# Patient Record
Sex: Female | Born: 1950 | Race: White | Hispanic: No | Smoking: Former smoker
Health system: Southern US, Community
[De-identification: ages and names within clinical notes are randomized; demographics above are authoritative.]

## PROBLEM LIST (undated history)

## (undated) DIAGNOSIS — I10 Essential (primary) hypertension: Secondary | ICD-10-CM

## (undated) DIAGNOSIS — J45909 Unspecified asthma, uncomplicated: Secondary | ICD-10-CM

## (undated) DIAGNOSIS — M549 Dorsalgia, unspecified: Secondary | ICD-10-CM

## (undated) DIAGNOSIS — M2550 Pain in unspecified joint: Secondary | ICD-10-CM

## (undated) DIAGNOSIS — U099 Post covid-19 condition, unspecified: Secondary | ICD-10-CM

## (undated) DIAGNOSIS — F32A Depression, unspecified: Secondary | ICD-10-CM

## (undated) DIAGNOSIS — R5383 Other fatigue: Secondary | ICD-10-CM

## (undated) DIAGNOSIS — E7800 Pure hypercholesterolemia, unspecified: Secondary | ICD-10-CM

## (undated) DIAGNOSIS — R194 Change in bowel habit: Secondary | ICD-10-CM

## (undated) DIAGNOSIS — D72819 Decreased white blood cell count, unspecified: Secondary | ICD-10-CM

## (undated) DIAGNOSIS — J449 Chronic obstructive pulmonary disease, unspecified: Secondary | ICD-10-CM

## (undated) HISTORY — DX: Depression, unspecified: F32.A

## (undated) HISTORY — DX: Post covid-19 condition, unspecified: U09.9

## (undated) HISTORY — PX: PROLAPSED UTERINE FIBROID LIGATION: SHX5400

## (undated) HISTORY — DX: Unspecified asthma, uncomplicated: J45.909

## (undated) HISTORY — DX: Dorsalgia, unspecified: M54.9

## (undated) HISTORY — DX: Other fatigue: R53.83

## (undated) HISTORY — DX: Pure hypercholesterolemia, unspecified: E78.00

## (undated) HISTORY — DX: Decreased white blood cell count, unspecified: D72.819

## (undated) HISTORY — DX: Chronic obstructive pulmonary disease, unspecified: J44.9

## (undated) HISTORY — DX: Change in bowel habit: R19.4

## (undated) HISTORY — DX: Essential (primary) hypertension: I10

## (undated) HISTORY — PX: BREAST BIOPSY: SHX20

## (undated) HISTORY — DX: Pain in unspecified joint: M25.50

---

## 2003-10-28 ENCOUNTER — Other Ambulatory Visit: Admission: RE | Admit: 2003-10-28 | Discharge: 2003-10-28 | Payer: Self-pay | Admitting: Family Medicine

## 2005-01-07 ENCOUNTER — Other Ambulatory Visit: Admission: RE | Admit: 2005-01-07 | Discharge: 2005-01-07 | Payer: Self-pay | Admitting: Family Medicine

## 2005-02-07 ENCOUNTER — Encounter (INDEPENDENT_AMBULATORY_CARE_PROVIDER_SITE_OTHER): Payer: Self-pay | Admitting: Specialist

## 2005-02-07 ENCOUNTER — Ambulatory Visit (HOSPITAL_COMMUNITY): Admission: RE | Admit: 2005-02-07 | Discharge: 2005-02-07 | Payer: Self-pay | Admitting: Gastroenterology

## 2006-04-14 ENCOUNTER — Encounter: Admission: RE | Admit: 2006-04-14 | Discharge: 2006-04-14 | Payer: Self-pay | Admitting: Family Medicine

## 2007-06-30 ENCOUNTER — Ambulatory Visit: Payer: Self-pay | Admitting: Unknown Physician Specialty

## 2007-07-02 ENCOUNTER — Ambulatory Visit: Payer: Self-pay | Admitting: Unknown Physician Specialty

## 2007-12-25 ENCOUNTER — Encounter: Admission: RE | Admit: 2007-12-25 | Discharge: 2007-12-25 | Payer: Self-pay | Admitting: Family Medicine

## 2008-04-01 ENCOUNTER — Emergency Department (HOSPITAL_COMMUNITY): Admission: EM | Admit: 2008-04-01 | Discharge: 2008-04-01 | Payer: Self-pay | Admitting: Emergency Medicine

## 2009-01-23 ENCOUNTER — Encounter: Admission: RE | Admit: 2009-01-23 | Discharge: 2009-01-23 | Payer: Self-pay | Admitting: Family Medicine

## 2009-04-14 ENCOUNTER — Encounter: Admission: RE | Admit: 2009-04-14 | Discharge: 2009-04-14 | Payer: Self-pay | Admitting: Family Medicine

## 2009-10-24 ENCOUNTER — Emergency Department (HOSPITAL_COMMUNITY): Admission: EM | Admit: 2009-10-24 | Discharge: 2009-10-24 | Payer: Self-pay | Admitting: Emergency Medicine

## 2010-12-12 LAB — POCT CARDIAC MARKERS
CKMB, poc: 1.9 ng/mL (ref 1.0–8.0)
Myoglobin, poc: 59.9 ng/mL (ref 12–200)
Troponin i, poc: <0.05 ng/mL (ref 0.00–0.09)
Troponin i, poc: <0.05 ng/mL (ref 0.00–0.09)

## 2010-12-12 LAB — DIFFERENTIAL
Lymphocytes Relative: 34 % (ref 12–46)
Monocytes Absolute: 0.5 10*3/uL (ref 0.1–1.0)
Neutro Abs: 4.5 10*3/uL (ref 1.7–7.7)
Neutrophils Relative %: 56 % (ref 43–77)

## 2010-12-12 LAB — URINALYSIS, ROUTINE W REFLEX MICROSCOPIC
Glucose, UA: NEGATIVE mg/dL
Ketones, ur: NEGATIVE mg/dL
Leukocytes, UA: NEGATIVE
Nitrite: NEGATIVE
Urobilinogen, UA: 0.2 mg/dL (ref 0.0–1.0)
pH: 6 (ref 5.0–8.0)

## 2010-12-12 LAB — BASIC METABOLIC PANEL
BUN: 19 mg/dL (ref 6–23)
CO2: 26 mEq/L (ref 19–32)
Calcium: 9.4 mg/dL (ref 8.4–10.5)
Chloride: 106 mEq/L (ref 96–112)
Creatinine, Ser: 0.61 mg/dL (ref 0.4–1.2)

## 2010-12-12 LAB — CBC
MCHC: 35.2 g/dL (ref 30.0–36.0)
MCV: 93.6 fL (ref 78.0–100.0)
RDW: 12.8 % (ref 11.5–15.5)

## 2010-12-12 LAB — URINE MICROSCOPIC-ADD ON

## 2011-06-20 LAB — DIFFERENTIAL
Basophils Absolute: 0.1
Basophils Relative: 1
Eosinophils Absolute: 0.2
Eosinophils Relative: 3
Lymphocytes Relative: 37
Lymphs Abs: 2.6
Monocytes Absolute: 0.5
Monocytes Relative: 7
Neutro Abs: 3.7
Neutrophils Relative %: 53

## 2011-06-20 LAB — CBC
HCT: 39.8
Hemoglobin: 13.9
MCHC: 34.8
MCV: 91.6
Platelets: 230
RBC: 4.35
RDW: 12.8
WBC: 7

## 2011-06-20 LAB — COMPREHENSIVE METABOLIC PANEL
ALT: 20
BUN: 13
CO2: 26
Chloride: 110
Creatinine, Ser: 0.6
GFR calc Af Amer: >60
Potassium: 3.8

## 2011-06-20 LAB — COMPREHENSIVE METABOLIC PANEL WITH GFR
AST: 22
Albumin: 3.7
Alkaline Phosphatase: 56
Calcium: 9.1
GFR calc non Af Amer: >60
Glucose, Bld: 101 — ABNORMAL HIGH
Sodium: 143
Total Bilirubin: 0.7
Total Protein: 5.7 — ABNORMAL LOW

## 2011-06-20 LAB — POCT CARDIAC MARKERS
CKMB, poc: 1.2
CKMB, poc: 1.5
Myoglobin, poc: 42
Myoglobin, poc: 42.6
Operator id: 231701
Operator id: 231701
Troponin i, poc: <0.05
Troponin i, poc: <0.05

## 2011-07-26 ENCOUNTER — Other Ambulatory Visit: Payer: Self-pay | Admitting: Family Medicine

## 2011-07-26 ENCOUNTER — Ambulatory Visit
Admission: RE | Admit: 2011-07-26 | Discharge: 2011-07-26 | Disposition: A | Discharge status: Home / Self Care | Payer: BC Managed Care – PPO | Source: Ambulatory Visit | Attending: Family Medicine | Admitting: Family Medicine

## 2011-07-26 DIAGNOSIS — R1030 Lower abdominal pain, unspecified: Secondary | ICD-10-CM

## 2011-07-26 DIAGNOSIS — R509 Fever, unspecified: Secondary | ICD-10-CM

## 2011-07-26 MED ORDER — IOHEXOL 300 MG/ML  SOLN
100.0000 mL | Freq: Once | INTRAMUSCULAR | Status: AC | PRN
  Administered 2011-07-26: 100 mL via INTRAVENOUS

## 2013-05-19 ENCOUNTER — Other Ambulatory Visit (HOSPITAL_COMMUNITY): Payer: Self-pay | Admitting: Family Medicine

## 2013-05-19 DIAGNOSIS — Z1231 Encounter for screening mammogram for malignant neoplasm of breast: Secondary | ICD-10-CM

## 2013-05-25 ENCOUNTER — Ambulatory Visit (HOSPITAL_COMMUNITY)
Admission: RE | Admit: 2013-05-25 | Discharge: 2013-05-25 | Disposition: A | Discharge status: Home / Self Care | Payer: BC Managed Care – PPO | Source: Ambulatory Visit | Attending: Family Medicine | Admitting: Family Medicine

## 2013-05-25 DIAGNOSIS — Z1231 Encounter for screening mammogram for malignant neoplasm of breast: Secondary | ICD-10-CM | POA: Insufficient documentation

## 2013-12-17 ENCOUNTER — Other Ambulatory Visit: Payer: Self-pay | Admitting: Family Medicine

## 2013-12-17 DIAGNOSIS — Q282 Arteriovenous malformation of cerebral vessels: Secondary | ICD-10-CM

## 2013-12-28 ENCOUNTER — Other Ambulatory Visit: Payer: BC Managed Care – PPO

## 2014-01-03 ENCOUNTER — Ambulatory Visit
Admission: RE | Admit: 2014-01-03 | Discharge: 2014-01-03 | Disposition: A | Discharge status: Home / Self Care | Payer: BC Managed Care – PPO | Source: Ambulatory Visit | Attending: Family Medicine | Admitting: Family Medicine

## 2014-01-03 DIAGNOSIS — Q282 Arteriovenous malformation of cerebral vessels: Secondary | ICD-10-CM

## 2014-01-03 MED ORDER — GADOBENATE DIMEGLUMINE 529 MG/ML IV SOLN
13.0000 mL | Freq: Once | INTRAVENOUS | Status: AC | PRN
Start: 2014-01-03 — End: 2014-01-03
  Administered 2014-01-03: 13 mL via INTRAVENOUS

## 2014-07-26 ENCOUNTER — Other Ambulatory Visit: Payer: Self-pay | Admitting: Family Medicine

## 2014-07-26 DIAGNOSIS — N6325 Unspecified lump in the left breast, overlapping quadrants: Secondary | ICD-10-CM

## 2014-07-26 DIAGNOSIS — N6320 Unspecified lump in the left breast, unspecified quadrant: Principal | ICD-10-CM

## 2014-08-04 ENCOUNTER — Other Ambulatory Visit: Payer: BC Managed Care – PPO

## 2014-08-05 ENCOUNTER — Ambulatory Visit
Admission: RE | Admit: 2014-08-05 | Discharge: 2014-08-05 | Disposition: A | Discharge status: Home / Self Care | Payer: BC Managed Care – PPO | Source: Ambulatory Visit | Attending: Family Medicine | Admitting: Family Medicine

## 2014-08-05 ENCOUNTER — Encounter (INDEPENDENT_AMBULATORY_CARE_PROVIDER_SITE_OTHER): Payer: Self-pay

## 2014-08-05 DIAGNOSIS — N6325 Unspecified lump in the left breast, overlapping quadrants: Secondary | ICD-10-CM

## 2014-08-05 DIAGNOSIS — N6320 Unspecified lump in the left breast, unspecified quadrant: Principal | ICD-10-CM

## 2014-08-05 DIAGNOSIS — R922 Inconclusive mammogram: Secondary | ICD-10-CM | POA: Diagnosis not present

## 2014-10-19 DIAGNOSIS — K589 Irritable bowel syndrome without diarrhea: Secondary | ICD-10-CM | POA: Diagnosis not present

## 2014-10-19 DIAGNOSIS — B078 Other viral warts: Secondary | ICD-10-CM | POA: Diagnosis not present

## 2014-10-19 DIAGNOSIS — N951 Menopausal and female climacteric states: Secondary | ICD-10-CM | POA: Diagnosis not present

## 2014-11-24 DIAGNOSIS — D225 Melanocytic nevi of trunk: Secondary | ICD-10-CM | POA: Diagnosis not present

## 2014-11-24 DIAGNOSIS — B001 Herpesviral vesicular dermatitis: Secondary | ICD-10-CM | POA: Diagnosis not present

## 2014-11-24 DIAGNOSIS — L814 Other melanin hyperpigmentation: Secondary | ICD-10-CM | POA: Diagnosis not present

## 2014-11-24 DIAGNOSIS — L570 Actinic keratosis: Secondary | ICD-10-CM | POA: Diagnosis not present

## 2014-11-24 DIAGNOSIS — B079 Viral wart, unspecified: Secondary | ICD-10-CM | POA: Diagnosis not present

## 2014-11-24 DIAGNOSIS — D2271 Melanocytic nevi of right lower limb, including hip: Secondary | ICD-10-CM | POA: Diagnosis not present

## 2014-11-24 DIAGNOSIS — L821 Other seborrheic keratosis: Secondary | ICD-10-CM | POA: Diagnosis not present

## 2014-11-24 DIAGNOSIS — Z85828 Personal history of other malignant neoplasm of skin: Secondary | ICD-10-CM | POA: Diagnosis not present

## 2014-12-30 DIAGNOSIS — K589 Irritable bowel syndrome without diarrhea: Secondary | ICD-10-CM | POA: Diagnosis not present

## 2014-12-30 DIAGNOSIS — N951 Menopausal and female climacteric states: Secondary | ICD-10-CM | POA: Diagnosis not present

## 2014-12-30 DIAGNOSIS — E782 Mixed hyperlipidemia: Secondary | ICD-10-CM | POA: Diagnosis not present

## 2014-12-30 DIAGNOSIS — Z1231 Encounter for screening mammogram for malignant neoplasm of breast: Secondary | ICD-10-CM | POA: Diagnosis not present

## 2014-12-30 DIAGNOSIS — N952 Postmenopausal atrophic vaginitis: Secondary | ICD-10-CM | POA: Diagnosis not present

## 2014-12-30 DIAGNOSIS — Z23 Encounter for immunization: Secondary | ICD-10-CM | POA: Diagnosis not present

## 2014-12-30 DIAGNOSIS — E559 Vitamin D deficiency, unspecified: Secondary | ICD-10-CM | POA: Diagnosis not present

## 2014-12-30 DIAGNOSIS — Z1211 Encounter for screening for malignant neoplasm of colon: Secondary | ICD-10-CM | POA: Diagnosis not present

## 2015-03-10 DIAGNOSIS — G4700 Insomnia, unspecified: Secondary | ICD-10-CM | POA: Diagnosis not present

## 2015-03-10 DIAGNOSIS — J309 Allergic rhinitis, unspecified: Secondary | ICD-10-CM | POA: Diagnosis not present

## 2015-03-10 DIAGNOSIS — N951 Menopausal and female climacteric states: Secondary | ICD-10-CM | POA: Diagnosis not present

## 2015-04-10 DIAGNOSIS — W64XXXA Exposure to other animate mechanical forces, initial encounter: Secondary | ICD-10-CM | POA: Diagnosis not present

## 2015-04-10 DIAGNOSIS — N952 Postmenopausal atrophic vaginitis: Secondary | ICD-10-CM | POA: Diagnosis not present

## 2015-04-10 DIAGNOSIS — N951 Menopausal and female climacteric states: Secondary | ICD-10-CM | POA: Diagnosis not present

## 2015-04-10 DIAGNOSIS — G4700 Insomnia, unspecified: Secondary | ICD-10-CM | POA: Diagnosis not present

## 2015-08-15 DIAGNOSIS — F411 Generalized anxiety disorder: Secondary | ICD-10-CM | POA: Diagnosis not present

## 2015-08-15 DIAGNOSIS — I10 Essential (primary) hypertension: Secondary | ICD-10-CM | POA: Diagnosis not present

## 2015-08-15 DIAGNOSIS — R0600 Dyspnea, unspecified: Secondary | ICD-10-CM | POA: Diagnosis not present

## 2015-08-15 DIAGNOSIS — G4700 Insomnia, unspecified: Secondary | ICD-10-CM | POA: Diagnosis not present

## 2015-11-03 ENCOUNTER — Other Ambulatory Visit: Payer: Self-pay

## 2015-11-03 DIAGNOSIS — Z1231 Encounter for screening mammogram for malignant neoplasm of breast: Secondary | ICD-10-CM

## 2015-11-28 ENCOUNTER — Ambulatory Visit
Admission: RE | Admit: 2015-11-28 | Discharge: 2015-11-28 | Disposition: A | Discharge status: Home / Self Care | Payer: BC Managed Care – PPO | Source: Ambulatory Visit

## 2015-11-28 DIAGNOSIS — Z1231 Encounter for screening mammogram for malignant neoplasm of breast: Secondary | ICD-10-CM

## 2016-01-24 DIAGNOSIS — F329 Major depressive disorder, single episode, unspecified: Secondary | ICD-10-CM | POA: Diagnosis not present

## 2016-01-24 DIAGNOSIS — F411 Generalized anxiety disorder: Secondary | ICD-10-CM | POA: Diagnosis not present

## 2016-02-13 DIAGNOSIS — I10 Essential (primary) hypertension: Secondary | ICD-10-CM | POA: Diagnosis not present

## 2016-02-13 DIAGNOSIS — F411 Generalized anxiety disorder: Secondary | ICD-10-CM | POA: Diagnosis not present

## 2016-02-13 DIAGNOSIS — G4700 Insomnia, unspecified: Secondary | ICD-10-CM | POA: Diagnosis not present

## 2016-05-23 DIAGNOSIS — F329 Major depressive disorder, single episode, unspecified: Secondary | ICD-10-CM | POA: Diagnosis not present

## 2016-05-23 DIAGNOSIS — E782 Mixed hyperlipidemia: Secondary | ICD-10-CM | POA: Diagnosis not present

## 2016-05-23 DIAGNOSIS — Z1211 Encounter for screening for malignant neoplasm of colon: Secondary | ICD-10-CM | POA: Diagnosis not present

## 2016-05-23 DIAGNOSIS — F411 Generalized anxiety disorder: Secondary | ICD-10-CM | POA: Diagnosis not present

## 2016-05-23 DIAGNOSIS — Z23 Encounter for immunization: Secondary | ICD-10-CM | POA: Diagnosis not present

## 2016-05-23 DIAGNOSIS — N952 Postmenopausal atrophic vaginitis: Secondary | ICD-10-CM | POA: Diagnosis not present

## 2016-05-23 DIAGNOSIS — Z0000 Encounter for general adult medical examination without abnormal findings: Secondary | ICD-10-CM | POA: Diagnosis not present

## 2016-06-13 DIAGNOSIS — H16223 Keratoconjunctivitis sicca, not specified as Sjogren's, bilateral: Secondary | ICD-10-CM | POA: Diagnosis not present

## 2016-06-13 DIAGNOSIS — H25013 Cortical age-related cataract, bilateral: Secondary | ICD-10-CM | POA: Diagnosis not present

## 2016-06-13 DIAGNOSIS — H2513 Age-related nuclear cataract, bilateral: Secondary | ICD-10-CM | POA: Diagnosis not present

## 2016-11-21 DIAGNOSIS — I10 Essential (primary) hypertension: Secondary | ICD-10-CM | POA: Diagnosis not present

## 2016-11-21 DIAGNOSIS — M1712 Unilateral primary osteoarthritis, left knee: Secondary | ICD-10-CM | POA: Diagnosis not present

## 2016-11-21 DIAGNOSIS — F329 Major depressive disorder, single episode, unspecified: Secondary | ICD-10-CM | POA: Diagnosis not present

## 2016-11-21 DIAGNOSIS — Z6824 Body mass index (BMI) 24.0-24.9, adult: Secondary | ICD-10-CM | POA: Diagnosis not present

## 2017-04-14 DIAGNOSIS — L821 Other seborrheic keratosis: Secondary | ICD-10-CM | POA: Diagnosis not present

## 2017-04-14 DIAGNOSIS — L820 Inflamed seborrheic keratosis: Secondary | ICD-10-CM | POA: Diagnosis not present

## 2017-06-23 DIAGNOSIS — H43811 Vitreous degeneration, right eye: Secondary | ICD-10-CM | POA: Diagnosis not present

## 2017-06-23 DIAGNOSIS — H2513 Age-related nuclear cataract, bilateral: Secondary | ICD-10-CM | POA: Diagnosis not present

## 2017-06-23 DIAGNOSIS — H16223 Keratoconjunctivitis sicca, not specified as Sjogren's, bilateral: Secondary | ICD-10-CM | POA: Diagnosis not present

## 2017-07-16 ENCOUNTER — Other Ambulatory Visit: Payer: Self-pay | Admitting: Family Medicine

## 2017-07-16 DIAGNOSIS — Z1231 Encounter for screening mammogram for malignant neoplasm of breast: Secondary | ICD-10-CM

## 2017-08-08 ENCOUNTER — Other Ambulatory Visit: Payer: Self-pay | Admitting: Family Medicine

## 2017-08-08 DIAGNOSIS — Z6823 Body mass index (BMI) 23.0-23.9, adult: Secondary | ICD-10-CM | POA: Diagnosis not present

## 2017-08-08 DIAGNOSIS — Z1211 Encounter for screening for malignant neoplasm of colon: Secondary | ICD-10-CM | POA: Diagnosis not present

## 2017-08-08 DIAGNOSIS — E2839 Other primary ovarian failure: Secondary | ICD-10-CM

## 2017-08-08 DIAGNOSIS — Z0000 Encounter for general adult medical examination without abnormal findings: Secondary | ICD-10-CM | POA: Diagnosis not present

## 2017-08-11 ENCOUNTER — Ambulatory Visit
Admission: RE | Admit: 2017-08-11 | Discharge: 2017-08-11 | Disposition: A | Discharge status: Home / Self Care | Payer: Medicare Other | Source: Ambulatory Visit | Attending: Family Medicine | Admitting: Family Medicine

## 2017-08-11 ENCOUNTER — Ambulatory Visit: Payer: Self-pay

## 2017-08-11 DIAGNOSIS — Z1231 Encounter for screening mammogram for malignant neoplasm of breast: Secondary | ICD-10-CM

## 2017-09-02 ENCOUNTER — Other Ambulatory Visit: Payer: Self-pay

## 2017-12-09 DIAGNOSIS — K580 Irritable bowel syndrome with diarrhea: Secondary | ICD-10-CM | POA: Diagnosis not present

## 2017-12-09 DIAGNOSIS — F331 Major depressive disorder, recurrent, moderate: Secondary | ICD-10-CM | POA: Diagnosis not present

## 2017-12-09 DIAGNOSIS — E782 Mixed hyperlipidemia: Secondary | ICD-10-CM | POA: Diagnosis not present

## 2017-12-09 DIAGNOSIS — Z6823 Body mass index (BMI) 23.0-23.9, adult: Secondary | ICD-10-CM | POA: Diagnosis not present

## 2017-12-09 DIAGNOSIS — F411 Generalized anxiety disorder: Secondary | ICD-10-CM | POA: Diagnosis not present

## 2017-12-09 DIAGNOSIS — I10 Essential (primary) hypertension: Secondary | ICD-10-CM | POA: Diagnosis not present

## 2018-05-21 DIAGNOSIS — Z1211 Encounter for screening for malignant neoplasm of colon: Secondary | ICD-10-CM | POA: Diagnosis not present

## 2018-05-21 DIAGNOSIS — Z86010 Personal history of colonic polyps: Secondary | ICD-10-CM | POA: Diagnosis not present

## 2018-05-21 DIAGNOSIS — K5730 Diverticulosis of large intestine without perforation or abscess without bleeding: Secondary | ICD-10-CM | POA: Diagnosis not present

## 2018-06-26 DIAGNOSIS — K635 Polyp of colon: Secondary | ICD-10-CM | POA: Diagnosis not present

## 2018-06-26 DIAGNOSIS — Z1211 Encounter for screening for malignant neoplasm of colon: Secondary | ICD-10-CM | POA: Diagnosis not present

## 2018-08-12 DIAGNOSIS — K580 Irritable bowel syndrome with diarrhea: Secondary | ICD-10-CM | POA: Diagnosis not present

## 2018-08-12 DIAGNOSIS — F329 Major depressive disorder, single episode, unspecified: Secondary | ICD-10-CM | POA: Diagnosis not present

## 2018-08-12 DIAGNOSIS — I10 Essential (primary) hypertension: Secondary | ICD-10-CM | POA: Diagnosis not present

## 2018-08-12 DIAGNOSIS — Z23 Encounter for immunization: Secondary | ICD-10-CM | POA: Diagnosis not present

## 2018-08-12 DIAGNOSIS — H6122 Impacted cerumen, left ear: Secondary | ICD-10-CM | POA: Diagnosis not present

## 2018-08-12 DIAGNOSIS — Z0000 Encounter for general adult medical examination without abnormal findings: Secondary | ICD-10-CM | POA: Diagnosis not present

## 2018-08-12 DIAGNOSIS — Z6823 Body mass index (BMI) 23.0-23.9, adult: Secondary | ICD-10-CM | POA: Diagnosis not present

## 2018-09-14 DIAGNOSIS — H2511 Age-related nuclear cataract, right eye: Secondary | ICD-10-CM | POA: Diagnosis not present

## 2018-09-14 DIAGNOSIS — H2513 Age-related nuclear cataract, bilateral: Secondary | ICD-10-CM | POA: Diagnosis not present

## 2018-09-14 DIAGNOSIS — H40013 Open angle with borderline findings, low risk, bilateral: Secondary | ICD-10-CM | POA: Diagnosis not present

## 2018-10-22 DIAGNOSIS — H2511 Age-related nuclear cataract, right eye: Secondary | ICD-10-CM | POA: Diagnosis not present

## 2018-11-12 DIAGNOSIS — H2512 Age-related nuclear cataract, left eye: Secondary | ICD-10-CM | POA: Diagnosis not present

## 2019-01-22 DIAGNOSIS — I10 Essential (primary) hypertension: Secondary | ICD-10-CM | POA: Diagnosis not present

## 2019-01-22 DIAGNOSIS — F411 Generalized anxiety disorder: Secondary | ICD-10-CM | POA: Diagnosis not present

## 2019-01-22 DIAGNOSIS — E039 Hypothyroidism, unspecified: Secondary | ICD-10-CM | POA: Diagnosis not present

## 2019-01-22 DIAGNOSIS — Z719 Counseling, unspecified: Secondary | ICD-10-CM | POA: Diagnosis not present

## 2019-04-06 DIAGNOSIS — E039 Hypothyroidism, unspecified: Secondary | ICD-10-CM | POA: Diagnosis not present

## 2019-04-06 DIAGNOSIS — E782 Mixed hyperlipidemia: Secondary | ICD-10-CM | POA: Diagnosis not present

## 2019-04-06 DIAGNOSIS — I10 Essential (primary) hypertension: Secondary | ICD-10-CM | POA: Diagnosis not present

## 2019-04-12 DIAGNOSIS — F411 Generalized anxiety disorder: Secondary | ICD-10-CM | POA: Diagnosis not present

## 2019-04-12 DIAGNOSIS — F329 Major depressive disorder, single episode, unspecified: Secondary | ICD-10-CM | POA: Diagnosis not present

## 2019-04-12 DIAGNOSIS — E782 Mixed hyperlipidemia: Secondary | ICD-10-CM | POA: Diagnosis not present

## 2019-04-12 DIAGNOSIS — I10 Essential (primary) hypertension: Secondary | ICD-10-CM | POA: Diagnosis not present

## 2019-04-12 DIAGNOSIS — E039 Hypothyroidism, unspecified: Secondary | ICD-10-CM | POA: Diagnosis not present

## 2019-04-15 DIAGNOSIS — M549 Dorsalgia, unspecified: Secondary | ICD-10-CM | POA: Diagnosis not present

## 2019-06-14 DIAGNOSIS — R5383 Other fatigue: Secondary | ICD-10-CM | POA: Diagnosis not present

## 2019-06-14 DIAGNOSIS — J029 Acute pharyngitis, unspecified: Secondary | ICD-10-CM | POA: Diagnosis not present

## 2019-06-14 DIAGNOSIS — F411 Generalized anxiety disorder: Secondary | ICD-10-CM | POA: Diagnosis not present

## 2019-06-14 DIAGNOSIS — F331 Major depressive disorder, recurrent, moderate: Secondary | ICD-10-CM | POA: Diagnosis not present

## 2019-06-15 ENCOUNTER — Other Ambulatory Visit: Payer: Self-pay

## 2019-06-15 DIAGNOSIS — R6889 Other general symptoms and signs: Secondary | ICD-10-CM | POA: Diagnosis not present

## 2019-06-15 DIAGNOSIS — Z20822 Contact with and (suspected) exposure to covid-19: Secondary | ICD-10-CM

## 2019-06-16 LAB — NOVEL CORONAVIRUS, NAA: SARS-CoV-2, NAA: NOT DETECTED

## 2019-06-17 DIAGNOSIS — J329 Chronic sinusitis, unspecified: Secondary | ICD-10-CM | POA: Diagnosis not present

## 2019-06-17 DIAGNOSIS — J029 Acute pharyngitis, unspecified: Secondary | ICD-10-CM | POA: Diagnosis not present

## 2019-06-17 DIAGNOSIS — R5383 Other fatigue: Secondary | ICD-10-CM | POA: Diagnosis not present

## 2019-06-29 DIAGNOSIS — Z23 Encounter for immunization: Secondary | ICD-10-CM | POA: Diagnosis not present

## 2020-03-17 ENCOUNTER — Other Ambulatory Visit: Payer: Self-pay | Admitting: Family Medicine

## 2020-03-17 DIAGNOSIS — R5381 Other malaise: Secondary | ICD-10-CM

## 2020-03-17 DIAGNOSIS — Z1231 Encounter for screening mammogram for malignant neoplasm of breast: Secondary | ICD-10-CM

## 2020-03-20 ENCOUNTER — Other Ambulatory Visit: Payer: Self-pay | Admitting: Family Medicine

## 2020-03-20 DIAGNOSIS — E559 Vitamin D deficiency, unspecified: Secondary | ICD-10-CM

## 2020-03-20 DIAGNOSIS — E2839 Other primary ovarian failure: Secondary | ICD-10-CM

## 2020-04-17 DIAGNOSIS — E039 Hypothyroidism, unspecified: Secondary | ICD-10-CM | POA: Diagnosis not present

## 2020-04-17 DIAGNOSIS — I10 Essential (primary) hypertension: Secondary | ICD-10-CM | POA: Diagnosis not present

## 2020-04-27 DIAGNOSIS — F331 Major depressive disorder, recurrent, moderate: Secondary | ICD-10-CM | POA: Diagnosis not present

## 2020-04-27 DIAGNOSIS — F411 Generalized anxiety disorder: Secondary | ICD-10-CM | POA: Diagnosis not present

## 2020-04-27 DIAGNOSIS — E039 Hypothyroidism, unspecified: Secondary | ICD-10-CM | POA: Diagnosis not present

## 2020-04-27 DIAGNOSIS — E782 Mixed hyperlipidemia: Secondary | ICD-10-CM | POA: Diagnosis not present

## 2020-04-27 DIAGNOSIS — G4700 Insomnia, unspecified: Secondary | ICD-10-CM | POA: Diagnosis not present

## 2020-04-27 DIAGNOSIS — I10 Essential (primary) hypertension: Secondary | ICD-10-CM | POA: Diagnosis not present

## 2020-06-06 ENCOUNTER — Ambulatory Visit
Admission: RE | Admit: 2020-06-06 | Discharge: 2020-06-06 | Disposition: A | Discharge status: Home / Self Care | Payer: Medicare Other | Source: Ambulatory Visit | Attending: Family Medicine | Admitting: Family Medicine

## 2020-06-06 ENCOUNTER — Other Ambulatory Visit: Payer: Self-pay

## 2020-06-06 DIAGNOSIS — Z1231 Encounter for screening mammogram for malignant neoplasm of breast: Secondary | ICD-10-CM

## 2020-06-06 DIAGNOSIS — Z780 Asymptomatic menopausal state: Secondary | ICD-10-CM | POA: Diagnosis not present

## 2020-06-06 DIAGNOSIS — M85851 Other specified disorders of bone density and structure, right thigh: Secondary | ICD-10-CM | POA: Diagnosis not present

## 2020-06-06 DIAGNOSIS — E2839 Other primary ovarian failure: Secondary | ICD-10-CM

## 2020-06-06 DIAGNOSIS — E559 Vitamin D deficiency, unspecified: Secondary | ICD-10-CM

## 2020-06-09 DIAGNOSIS — Z23 Encounter for immunization: Secondary | ICD-10-CM | POA: Diagnosis not present

## 2020-06-16 DIAGNOSIS — K219 Gastro-esophageal reflux disease without esophagitis: Secondary | ICD-10-CM | POA: Diagnosis not present

## 2020-06-16 DIAGNOSIS — I10 Essential (primary) hypertension: Secondary | ICD-10-CM | POA: Diagnosis not present

## 2020-06-16 DIAGNOSIS — F411 Generalized anxiety disorder: Secondary | ICD-10-CM | POA: Diagnosis not present

## 2020-06-16 DIAGNOSIS — M8580 Other specified disorders of bone density and structure, unspecified site: Secondary | ICD-10-CM | POA: Diagnosis not present

## 2020-06-21 DIAGNOSIS — E559 Vitamin D deficiency, unspecified: Secondary | ICD-10-CM | POA: Diagnosis not present

## 2020-06-21 DIAGNOSIS — E039 Hypothyroidism, unspecified: Secondary | ICD-10-CM | POA: Diagnosis not present

## 2020-07-18 DIAGNOSIS — E039 Hypothyroidism, unspecified: Secondary | ICD-10-CM | POA: Diagnosis not present

## 2020-07-18 DIAGNOSIS — E559 Vitamin D deficiency, unspecified: Secondary | ICD-10-CM | POA: Diagnosis not present

## 2020-07-18 DIAGNOSIS — I10 Essential (primary) hypertension: Secondary | ICD-10-CM | POA: Diagnosis not present

## 2020-07-18 DIAGNOSIS — K219 Gastro-esophageal reflux disease without esophagitis: Secondary | ICD-10-CM | POA: Diagnosis not present

## 2020-07-18 DIAGNOSIS — M25519 Pain in unspecified shoulder: Secondary | ICD-10-CM | POA: Diagnosis not present

## 2020-07-27 DIAGNOSIS — Z23 Encounter for immunization: Secondary | ICD-10-CM | POA: Diagnosis not present

## 2020-10-09 DIAGNOSIS — I10 Essential (primary) hypertension: Secondary | ICD-10-CM | POA: Diagnosis not present

## 2020-10-09 DIAGNOSIS — E559 Vitamin D deficiency, unspecified: Secondary | ICD-10-CM | POA: Diagnosis not present

## 2020-10-09 DIAGNOSIS — E039 Hypothyroidism, unspecified: Secondary | ICD-10-CM | POA: Diagnosis not present

## 2020-10-09 DIAGNOSIS — E782 Mixed hyperlipidemia: Secondary | ICD-10-CM | POA: Diagnosis not present

## 2020-10-16 DIAGNOSIS — I10 Essential (primary) hypertension: Secondary | ICD-10-CM | POA: Diagnosis not present

## 2020-10-16 DIAGNOSIS — F418 Other specified anxiety disorders: Secondary | ICD-10-CM | POA: Diagnosis not present

## 2020-10-16 DIAGNOSIS — N952 Postmenopausal atrophic vaginitis: Secondary | ICD-10-CM | POA: Diagnosis not present

## 2020-10-31 DIAGNOSIS — I10 Essential (primary) hypertension: Secondary | ICD-10-CM | POA: Diagnosis not present

## 2020-11-30 DIAGNOSIS — E782 Mixed hyperlipidemia: Secondary | ICD-10-CM | POA: Diagnosis not present

## 2020-11-30 DIAGNOSIS — E559 Vitamin D deficiency, unspecified: Secondary | ICD-10-CM | POA: Diagnosis not present

## 2020-11-30 DIAGNOSIS — E039 Hypothyroidism, unspecified: Secondary | ICD-10-CM | POA: Diagnosis not present

## 2020-11-30 DIAGNOSIS — I10 Essential (primary) hypertension: Secondary | ICD-10-CM | POA: Diagnosis not present

## 2020-12-04 DIAGNOSIS — E782 Mixed hyperlipidemia: Secondary | ICD-10-CM | POA: Diagnosis not present

## 2020-12-04 DIAGNOSIS — E039 Hypothyroidism, unspecified: Secondary | ICD-10-CM | POA: Diagnosis not present

## 2020-12-04 DIAGNOSIS — E559 Vitamin D deficiency, unspecified: Secondary | ICD-10-CM | POA: Diagnosis not present

## 2020-12-04 DIAGNOSIS — I10 Essential (primary) hypertension: Secondary | ICD-10-CM | POA: Diagnosis not present

## 2021-03-21 DIAGNOSIS — F411 Generalized anxiety disorder: Secondary | ICD-10-CM | POA: Diagnosis not present

## 2021-03-21 DIAGNOSIS — J069 Acute upper respiratory infection, unspecified: Secondary | ICD-10-CM | POA: Diagnosis not present

## 2021-03-21 DIAGNOSIS — R509 Fever, unspecified: Secondary | ICD-10-CM | POA: Diagnosis not present

## 2021-03-21 DIAGNOSIS — I10 Essential (primary) hypertension: Secondary | ICD-10-CM | POA: Diagnosis not present

## 2021-03-21 DIAGNOSIS — F331 Major depressive disorder, recurrent, moderate: Secondary | ICD-10-CM | POA: Diagnosis not present

## 2021-04-05 DIAGNOSIS — E039 Hypothyroidism, unspecified: Secondary | ICD-10-CM | POA: Diagnosis not present

## 2021-04-05 DIAGNOSIS — E559 Vitamin D deficiency, unspecified: Secondary | ICD-10-CM | POA: Diagnosis not present

## 2021-04-05 DIAGNOSIS — I10 Essential (primary) hypertension: Secondary | ICD-10-CM | POA: Diagnosis not present

## 2021-04-10 DIAGNOSIS — I10 Essential (primary) hypertension: Secondary | ICD-10-CM | POA: Diagnosis not present

## 2021-04-10 DIAGNOSIS — M25369 Other instability, unspecified knee: Secondary | ICD-10-CM | POA: Diagnosis not present

## 2021-04-10 DIAGNOSIS — H9201 Otalgia, right ear: Secondary | ICD-10-CM | POA: Diagnosis not present

## 2021-04-10 DIAGNOSIS — E559 Vitamin D deficiency, unspecified: Secondary | ICD-10-CM | POA: Diagnosis not present

## 2021-04-10 DIAGNOSIS — E782 Mixed hyperlipidemia: Secondary | ICD-10-CM | POA: Diagnosis not present

## 2021-04-10 DIAGNOSIS — E039 Hypothyroidism, unspecified: Secondary | ICD-10-CM | POA: Diagnosis not present

## 2021-05-01 DIAGNOSIS — Z0000 Encounter for general adult medical examination without abnormal findings: Secondary | ICD-10-CM | POA: Diagnosis not present

## 2021-05-01 DIAGNOSIS — Z1339 Encounter for screening examination for other mental health and behavioral disorders: Secondary | ICD-10-CM | POA: Diagnosis not present

## 2021-05-01 DIAGNOSIS — Z1331 Encounter for screening for depression: Secondary | ICD-10-CM | POA: Diagnosis not present

## 2021-05-01 DIAGNOSIS — Z87891 Personal history of nicotine dependence: Secondary | ICD-10-CM | POA: Diagnosis not present

## 2021-05-09 DIAGNOSIS — Z23 Encounter for immunization: Secondary | ICD-10-CM | POA: Diagnosis not present

## 2021-05-30 ENCOUNTER — Other Ambulatory Visit: Payer: Self-pay | Admitting: Family Medicine

## 2021-05-30 DIAGNOSIS — Z87891 Personal history of nicotine dependence: Secondary | ICD-10-CM

## 2021-06-14 DIAGNOSIS — H43811 Vitreous degeneration, right eye: Secondary | ICD-10-CM | POA: Diagnosis not present

## 2021-06-14 DIAGNOSIS — H16223 Keratoconjunctivitis sicca, not specified as Sjogren's, bilateral: Secondary | ICD-10-CM | POA: Diagnosis not present

## 2021-06-14 DIAGNOSIS — Z961 Presence of intraocular lens: Secondary | ICD-10-CM | POA: Diagnosis not present

## 2021-06-20 DIAGNOSIS — G5762 Lesion of plantar nerve, left lower limb: Secondary | ICD-10-CM | POA: Diagnosis not present

## 2021-06-20 DIAGNOSIS — F411 Generalized anxiety disorder: Secondary | ICD-10-CM | POA: Diagnosis not present

## 2021-06-20 DIAGNOSIS — J069 Acute upper respiratory infection, unspecified: Secondary | ICD-10-CM | POA: Diagnosis not present

## 2021-07-07 ENCOUNTER — Ambulatory Visit
Admission: RE | Admit: 2021-07-07 | Discharge: 2021-07-07 | Disposition: A | Discharge status: Home / Self Care | Payer: BC Managed Care – PPO | Source: Ambulatory Visit | Attending: Family Medicine | Admitting: Family Medicine

## 2021-07-07 ENCOUNTER — Other Ambulatory Visit: Payer: Self-pay

## 2021-07-07 DIAGNOSIS — Z87891 Personal history of nicotine dependence: Secondary | ICD-10-CM

## 2021-07-09 ENCOUNTER — Other Ambulatory Visit: Payer: Self-pay | Admitting: Family Medicine

## 2021-07-09 DIAGNOSIS — Z1231 Encounter for screening mammogram for malignant neoplasm of breast: Secondary | ICD-10-CM

## 2021-07-16 DIAGNOSIS — Z23 Encounter for immunization: Secondary | ICD-10-CM | POA: Diagnosis not present

## 2021-08-09 ENCOUNTER — Ambulatory Visit
Admission: RE | Admit: 2021-08-09 | Discharge: 2021-08-09 | Disposition: A | Discharge status: Home / Self Care | Payer: Medicare Other | Source: Ambulatory Visit | Attending: Family Medicine | Admitting: Family Medicine

## 2021-08-09 DIAGNOSIS — Z1231 Encounter for screening mammogram for malignant neoplasm of breast: Secondary | ICD-10-CM | POA: Diagnosis not present

## 2021-09-10 DIAGNOSIS — B9689 Other specified bacterial agents as the cause of diseases classified elsewhere: Secondary | ICD-10-CM | POA: Diagnosis not present

## 2021-09-10 DIAGNOSIS — U071 COVID-19: Secondary | ICD-10-CM | POA: Diagnosis not present

## 2021-09-10 DIAGNOSIS — J329 Chronic sinusitis, unspecified: Secondary | ICD-10-CM | POA: Diagnosis not present

## 2021-09-10 DIAGNOSIS — J069 Acute upper respiratory infection, unspecified: Secondary | ICD-10-CM | POA: Diagnosis not present

## 2021-11-19 ENCOUNTER — Ambulatory Visit
Admission: RE | Admit: 2021-11-19 | Discharge: 2021-11-19 | Disposition: A | Discharge status: Home / Self Care | Payer: Medicare PPO | Source: Ambulatory Visit | Attending: Family Medicine | Admitting: Family Medicine

## 2021-11-19 ENCOUNTER — Other Ambulatory Visit: Payer: Self-pay

## 2021-11-19 ENCOUNTER — Other Ambulatory Visit: Payer: Self-pay | Admitting: Family Medicine

## 2021-11-19 DIAGNOSIS — R053 Chronic cough: Secondary | ICD-10-CM

## 2021-11-19 DIAGNOSIS — R059 Cough, unspecified: Secondary | ICD-10-CM

## 2021-11-19 DIAGNOSIS — J069 Acute upper respiratory infection, unspecified: Secondary | ICD-10-CM

## 2021-11-23 DIAGNOSIS — J189 Pneumonia, unspecified organism: Secondary | ICD-10-CM | POA: Diagnosis not present

## 2021-11-25 IMAGING — MG DIGITAL SCREENING BILAT W/ TOMO W/ CAD
6 of 10 series · 6 of 30 positions shown · non-contrast
Comparison: Previous exam(s).

CLINICAL DATA: Screening.

EXAM:
DIGITAL SCREENING BILATERAL MAMMOGRAM WITH TOMO AND CAD

[L MLO synth-2D (1 of 2)]
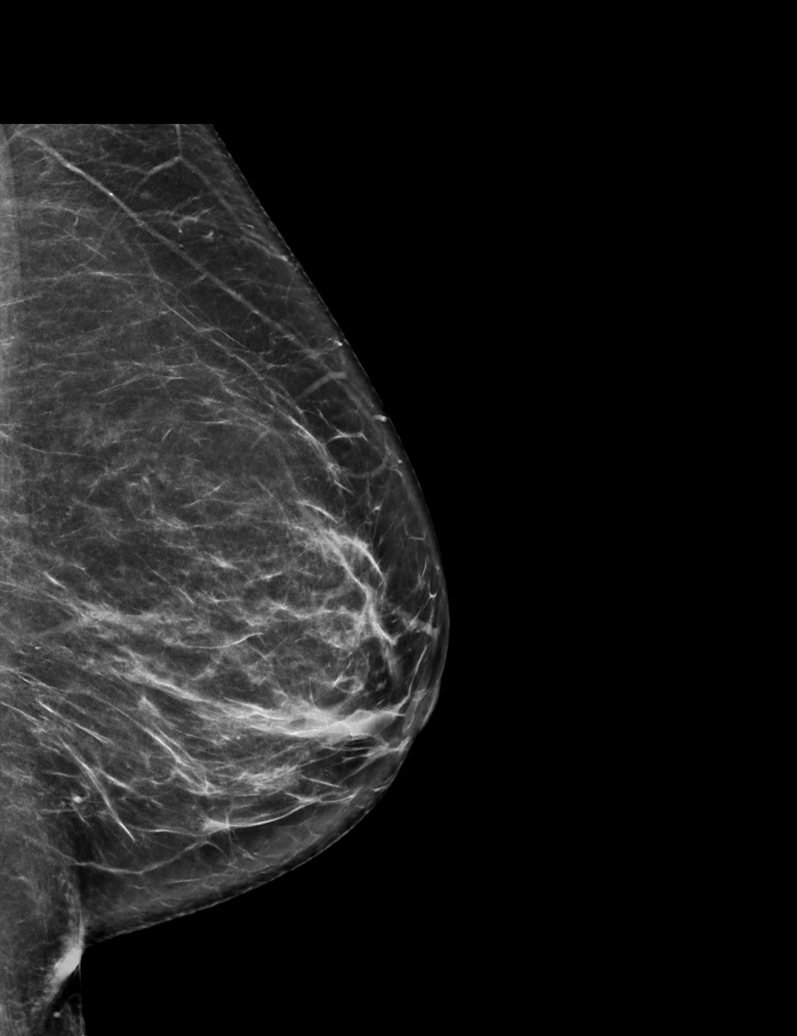

[L MLO synth-2D (2 of 2)]
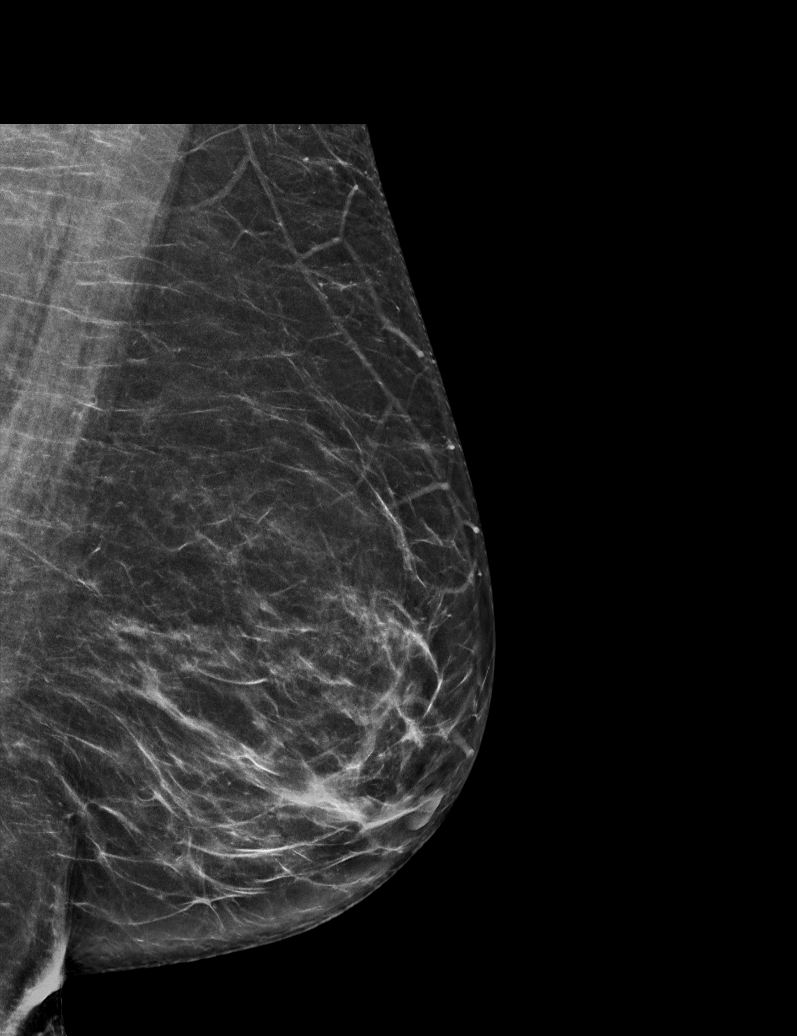

[L CC synth-2D]
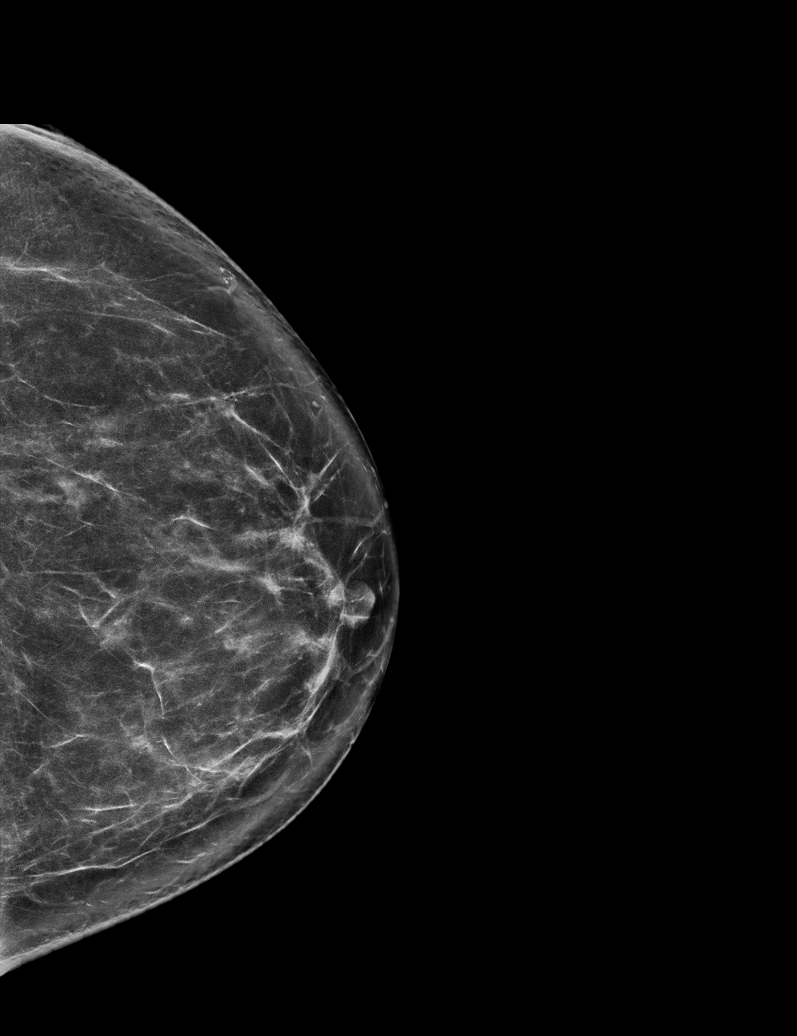

[R MLO synth-2D]
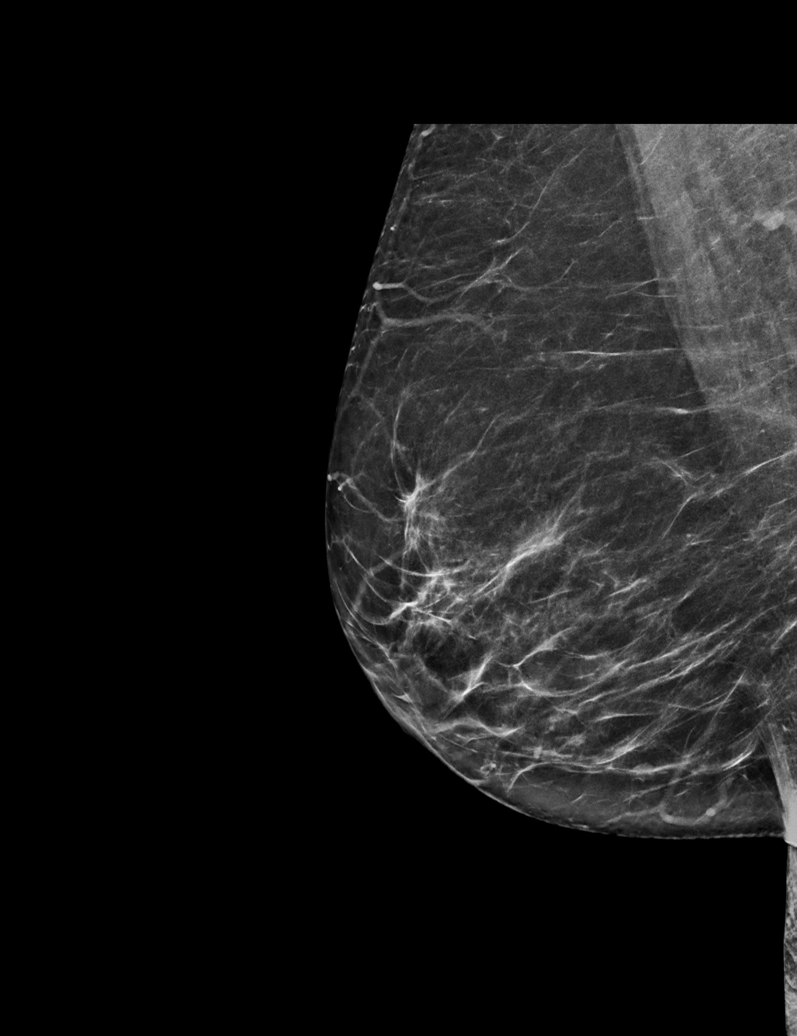

[R CC synth-2D]
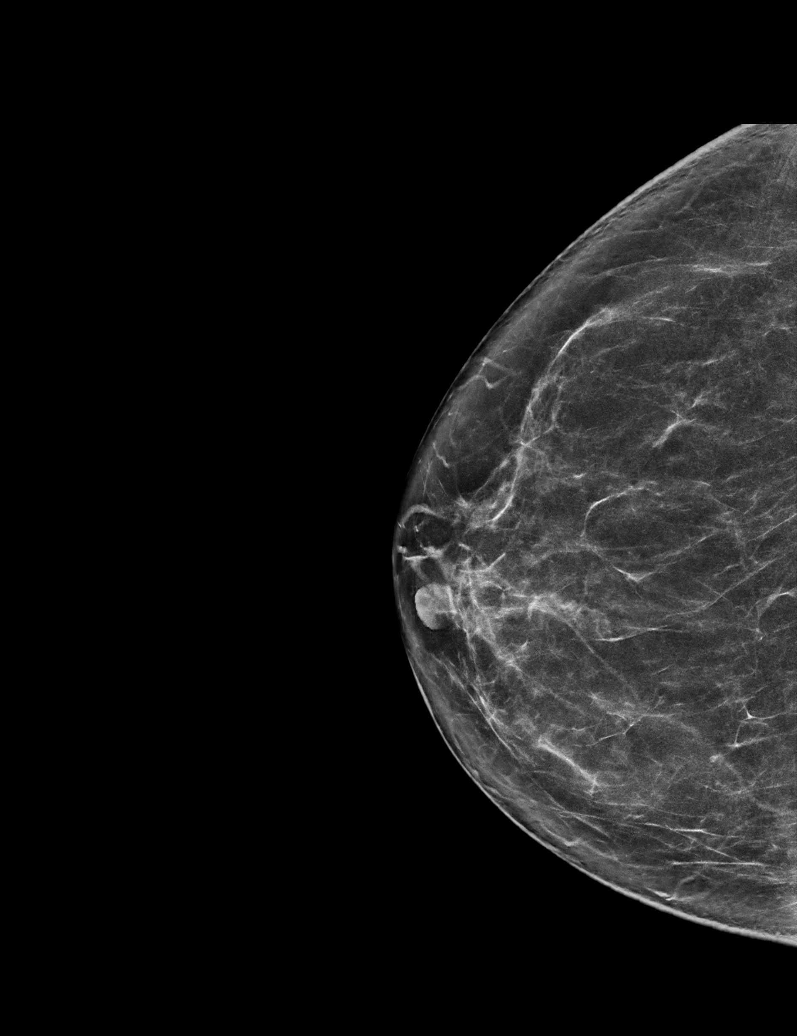

[R CC tomo · tomo slice 35/69.0]
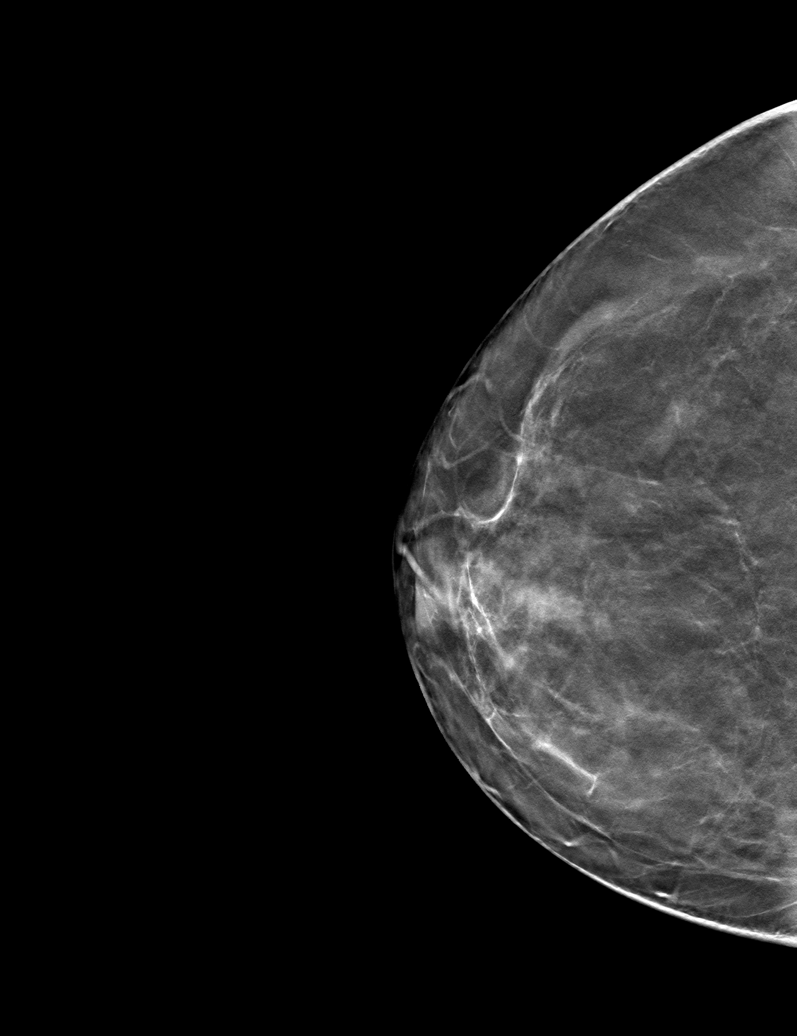

[6 of 30 positions shown; findings below may reference images not displayed]

ACR Breast Density Category b: There are scattered areas of
fibroglandular density.
FINDINGS: There are no findings suspicious for malignancy. Images were
processed with CAD.
IMPRESSION: No mammographic evidence of malignancy. A result letter of this
screening mammogram will be mailed directly to the patient.

RECOMMENDATION:
Screening mammogram in one year. (Code:[id number])

BI-RADS CATEGORY  1: Negative.

## 2021-11-27 DIAGNOSIS — J189 Pneumonia, unspecified organism: Secondary | ICD-10-CM | POA: Diagnosis not present

## 2021-11-27 DIAGNOSIS — H6123 Impacted cerumen, bilateral: Secondary | ICD-10-CM | POA: Diagnosis not present

## 2021-11-27 DIAGNOSIS — J069 Acute upper respiratory infection, unspecified: Secondary | ICD-10-CM | POA: Diagnosis not present

## 2021-11-27 DIAGNOSIS — J40 Bronchitis, not specified as acute or chronic: Secondary | ICD-10-CM | POA: Diagnosis not present

## 2021-11-27 DIAGNOSIS — J309 Allergic rhinitis, unspecified: Secondary | ICD-10-CM | POA: Diagnosis not present

## 2021-11-27 DIAGNOSIS — N952 Postmenopausal atrophic vaginitis: Secondary | ICD-10-CM | POA: Diagnosis not present

## 2021-12-18 DIAGNOSIS — U099 Post covid-19 condition, unspecified: Secondary | ICD-10-CM | POA: Diagnosis not present

## 2021-12-18 DIAGNOSIS — G9332 Myalgic encephalomyelitis/chronic fatigue syndrome: Secondary | ICD-10-CM | POA: Diagnosis not present

## 2021-12-18 DIAGNOSIS — R509 Fever, unspecified: Secondary | ICD-10-CM | POA: Diagnosis not present

## 2021-12-18 DIAGNOSIS — R5383 Other fatigue: Secondary | ICD-10-CM | POA: Diagnosis not present

## 2021-12-26 DIAGNOSIS — R5383 Other fatigue: Secondary | ICD-10-CM | POA: Diagnosis not present

## 2021-12-26 DIAGNOSIS — G9332 Myalgic encephalomyelitis/chronic fatigue syndrome: Secondary | ICD-10-CM | POA: Diagnosis not present

## 2021-12-31 DIAGNOSIS — G9332 Myalgic encephalomyelitis/chronic fatigue syndrome: Secondary | ICD-10-CM | POA: Diagnosis not present

## 2021-12-31 DIAGNOSIS — R509 Fever, unspecified: Secondary | ICD-10-CM | POA: Diagnosis not present

## 2021-12-31 DIAGNOSIS — R195 Other fecal abnormalities: Secondary | ICD-10-CM | POA: Diagnosis not present

## 2021-12-31 DIAGNOSIS — U099 Post covid-19 condition, unspecified: Secondary | ICD-10-CM | POA: Diagnosis not present

## 2021-12-31 DIAGNOSIS — D721 Eosinophilia, unspecified: Secondary | ICD-10-CM | POA: Diagnosis not present

## 2022-01-01 ENCOUNTER — Other Ambulatory Visit: Payer: Self-pay | Admitting: Nurse Practitioner

## 2022-01-01 ENCOUNTER — Ambulatory Visit
Admission: RE | Admit: 2022-01-01 | Discharge: 2022-01-01 | Disposition: A | Discharge status: Home / Self Care | Payer: Medicare PPO | Source: Ambulatory Visit | Attending: Nurse Practitioner | Admitting: Nurse Practitioner

## 2022-01-01 DIAGNOSIS — R0602 Shortness of breath: Secondary | ICD-10-CM | POA: Diagnosis not present

## 2022-01-01 DIAGNOSIS — E039 Hypothyroidism, unspecified: Secondary | ICD-10-CM | POA: Diagnosis not present

## 2022-01-17 DIAGNOSIS — F411 Generalized anxiety disorder: Secondary | ICD-10-CM | POA: Diagnosis not present

## 2022-01-17 DIAGNOSIS — F331 Major depressive disorder, recurrent, moderate: Secondary | ICD-10-CM | POA: Diagnosis not present

## 2022-01-17 DIAGNOSIS — U099 Post covid-19 condition, unspecified: Secondary | ICD-10-CM | POA: Diagnosis not present

## 2022-01-17 DIAGNOSIS — K589 Irritable bowel syndrome without diarrhea: Secondary | ICD-10-CM | POA: Diagnosis not present

## 2022-01-17 DIAGNOSIS — J449 Chronic obstructive pulmonary disease, unspecified: Secondary | ICD-10-CM | POA: Diagnosis not present

## 2022-01-17 DIAGNOSIS — G4700 Insomnia, unspecified: Secondary | ICD-10-CM | POA: Diagnosis not present

## 2022-01-24 DIAGNOSIS — R197 Diarrhea, unspecified: Secondary | ICD-10-CM | POA: Diagnosis not present

## 2022-02-15 DIAGNOSIS — D7289 Other specified disorders of white blood cells: Secondary | ICD-10-CM | POA: Diagnosis not present

## 2022-02-15 DIAGNOSIS — K529 Noninfective gastroenteritis and colitis, unspecified: Secondary | ICD-10-CM | POA: Diagnosis not present

## 2022-02-15 DIAGNOSIS — J45909 Unspecified asthma, uncomplicated: Secondary | ICD-10-CM | POA: Diagnosis not present

## 2022-02-19 DIAGNOSIS — J449 Chronic obstructive pulmonary disease, unspecified: Secondary | ICD-10-CM | POA: Diagnosis not present

## 2022-02-19 DIAGNOSIS — R194 Change in bowel habit: Secondary | ICD-10-CM | POA: Diagnosis not present

## 2022-02-19 DIAGNOSIS — D72819 Decreased white blood cell count, unspecified: Secondary | ICD-10-CM | POA: Diagnosis not present

## 2022-02-19 DIAGNOSIS — U099 Post covid-19 condition, unspecified: Secondary | ICD-10-CM | POA: Diagnosis not present

## 2022-02-20 ENCOUNTER — Telehealth: Payer: Self-pay

## 2022-02-20 NOTE — Telephone Encounter (Signed)
NOTES SCANNED TO REFERRAL 

## 2022-03-08 ENCOUNTER — Encounter: Payer: Self-pay | Admitting: Cardiovascular Disease

## 2022-03-08 ENCOUNTER — Ambulatory Visit: Payer: Medicare PPO | Admitting: Cardiovascular Disease

## 2022-03-08 VITALS — BP 132/70 | HR 65 | Ht 64.0 in | Wt 142.6 lb

## 2022-03-08 DIAGNOSIS — I447 Left bundle-branch block, unspecified: Secondary | ICD-10-CM

## 2022-03-08 DIAGNOSIS — R0609 Other forms of dyspnea: Secondary | ICD-10-CM

## 2022-03-08 NOTE — Patient Instructions (Signed)
Medication Instructions:  Your physician recommends that you continue on your current medications as directed. Please refer to the Current Medication list given to you today.  *If you need a refill on your cardiac medications before your next appointment, please call your pharmacy*   Lab Work: NONE If you have labs (blood work) drawn today and your tests are completely normal, you will receive your results only by: [place] (if you have MyChart) OR A paper copy in the mail If you have any lab test that is abnormal or we need to change your treatment, we will call you to review the results.   Testing/Procedures: ECHO Your physician has requested that you have an echocardiogram. Echocardiography is a painless test that uses sound waves to create images of your heart. It provides your doctor with information about the size and shape of your heart and how well your heart's chambers and valves are working. This procedure takes approximately one hour. There are no restrictions for this procedure.  Exercise Myoview Stress Test Your physician has requested that you have en exercise stress myoview. For further information please visit [email or web address]. Please follow instruction sheet, as given.  Follow-Up: At [facility], you and your health needs are our priority.  As part of our continuing mission to provide you with exceptional heart care, we have created designated Provider Care Teams.  These Care Teams include your primary Cardiologist (physician) and Advanced Practice Providers (APPs -  Physician Assistants and Nurse Practitioners) who all work together to provide you with the care you need, when you need it.  Your next appointment:   3 month(s)  The format for your next appointment:   In Person  Provider:   Ronn Melena, or [place]     Other Instructions See separate sheet for stress test instructions  Important Information About Sugar

## 2022-03-08 NOTE — Progress Notes (Signed)
Cardiology Office Note:    Date:  03/08/2022   ID:  Burman Nieves, DOB 1951-02-20, MRN [id number]  PCP:  Fanny Bien, MD   [facility] Providers Cardiologist:  Baila Rouse     Referring MD: Fanny Bien, MD   Chief Complaint  Patient presents with   Shortness of Breath    March 08, 2022    Conor Filsaime is a 71 y.o. female with a hx of hypertension, hyperlipidemia, We are asked to see her today by Dr. Ernie Hew  for further evaluation of shortness of breath after exercise. 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  Had  COVID Dec 2022 Had received 3 COVID 19.    Was very active until COVID  For months she was not able to do anything except bed to couch Seems to be feeling better  Has good days and then is tired the next day  Seems to have prolonged recovery -it takes her longer than 1 day to recover from a busy day.  She has not taken any medicine for this long COVID Still has some brain fog, No GI symptoms   No CP during or after covid   Has HLD - lipids are good on rosuvastatin   Has a new LBBB    Past Medical History:  Diagnosis Date   Back pain    Bowel habit changes    COPD (chronic obstructive pulmonary disease) (HCC)    COVID-19 long hauler    Depression    Fatigue    High cholesterol    HTN (hypertension)    Joint pain    Leukopenia    RAD (reactive airway disease)    AFTER COVID    Past Surgical History:  Procedure Laterality Date   BREAST BIOPSY Left    PROLAPSED UTERINE FIBROID LIGATION      Current Medications: Current Meds  Medication Sig   albuterol (VENTOLIN HFA) 108 (90 Base) MCG/ACT inhaler Inhale 1-2 puffs into the lungs every 6 (six) hours as needed for wheezing or shortness of breath.   amLODipine (NORVASC) 10 MG tablet Take 10 mg by mouth daily.   bismuth subsalicylate (PEPTO BISMOL) 262 MG chewable tablet Chew 524 mg by mouth as needed.   Cholecalciferol 125 MCG (5000 UT) TABS Take by mouth.    cycloSPORINE (RESTASIS) 0.05 % ophthalmic emulsion INSTILL 1 DROP(S) IN EACH EYE BY OPHTHALMIC ROUTE TWICE A DAY   escitalopram (LEXAPRO) 10 MG tablet Take 10 mg by mouth daily.   estradiol (ESTRACE) 0.1 MG/GM vaginal cream Place 1 Applicatorful vaginally at bedtime.   fluticasone-salmeterol (ADVAIR) 500-50 MCG/ACT AEPB Inhale 1 puff into the lungs in the morning and at bedtime.   levothyroxine (SYNTHROID) 100 MCG tablet Take 100 mcg by mouth daily before breakfast.   loratadine (CLARITIN) 10 MG tablet Take 10 mg by mouth daily.   Prenatal 6.75-0.2 MG TABS Take by mouth.   rosuvastatin (CRESTOR) 10 MG tablet Take 10 mg by mouth daily.     Allergies:   Patient has no known allergies.   Social History   Socioeconomic History   Marital status: Divorced    Spouse name: Not on file   Number of children: Not on file   Years of education: Not on file   Highest education level: Not on file  Occupational History   Not on file  Tobacco Use   Smoking status: Former    Types: Cigarettes   Smokeless tobacco: Never  Substance and  Sexual Activity   Alcohol use: Not on file   Drug use: Not on file   Sexual activity: Not on file  Other Topics Concern   Not on file  Social History Narrative   Not on file   Social Determinants of Health   Financial Resource Strain: Not on file  Food Insecurity: Not on file  Transportation Needs: Not on file  Physical Activity: Not on file  Stress: Not on file  Social Connections: Not on file     Family History: The patient's family history includes Breast cancer in her mother; Breast cancer (age of onset: 84) in her sister; Cancer in her mother and paternal grandmother; Hyperlipidemia in her maternal grandfather, maternal grandmother, and mother; Hypertension in her brother, maternal grandmother, and mother; Osteoporosis in her mother.  ROS:   Please see the history of present illness.     All other systems reviewed and are negative.  EKGs/Labs/Other  Studies Reviewed:    The following studies were reviewed today:   EKG:  EKG from 2005 reveals normal sinus rhythm with poor R wave progression.  QRS duration is 96 ms.  EKG from March 08, 2022 reveals normal sinus rhythm at 65.  She has poor R wave progression.  She has developed a new left bundle branch block.  Recent Labs: No results found for requested labs within last 365 days.  Recent Lipid Panel No results found for: "CHOL", "TRIG", "HDL", "CHOLHDL", "VLDL", "LDLCALC", "LDLDIRECT"   Risk Assessment/Calculations:           Physical Exam:    VS:  BP 132/70   Pulse 65   Ht '5\' 4"'$  (1.626 m)   Wt 142 lb 9.6 oz (64.7 kg)   SpO2 95%   BMI 24.48 kg/m     Wt Readings from Last 3 Encounters:  03/08/22 142 lb 9.6 oz (64.7 kg)     GEN:  Well nourished, well developed in no acute distress HEENT: Normal NECK: No JVD; No carotid bruits LYMPHATICS: No lymphadenopathy CARDIAC: RRR, no murmurs, rubs, gallops RESPIRATORY:  Clear to auscultation without rales, wheezing or rhonchi  ABDOMEN: Soft, non-tender, non-distended MUSCULOSKELETAL:  No edema; No deformity  SKIN: Warm and dry NEUROLOGIC:  Alert and oriented x 3 PSYCHIATRIC:  Normal affect   ASSESSMENT:    1. LBBB (left bundle branch block)   2. DOE (dyspnea on exertion)    PLAN:     Shortness of breath with exertion: Voncile presents with shortness of breath with exertion following COVID-19 infection in December, 2022.  She is also developed a new left bundle branch block. I would like to get an echocardiogram for further evaluation of her LV function.  We will also get a exercise Myoview study for further evaluation and to rule out ischemia.  I encouraged her to continue to exercise at a low to moderate level.  If we see that her Myoview is low risk and her echo looks good then we will have her increase her exercise at that point.  She will return to see [place] in 3 months.      Shared Decision Making/Informed  Consent{ arrhythmias, dizziness, blood pressure fluctuations, myocardial infarction, stroke/transient ischemic attack, nausea, vomiting, allergic reaction, radiation exposure, metallic taste sensation and life-threatening complications (estimated to be 1 in 10,000)], benefits (risk stratification, diagnosing coronary artery disease, treatment guidance) and alternatives of a nuclear stress test were discussed in detail with Ms. Rossitto and she agrees to proceed.    Medication Adjustments/Labs and  Tests Ordered: Current medicines are reviewed at length with the patient today.  Concerns regarding medicines are outlined above.  Orders Placed This Encounter  Procedures   MYOCARDIAL PERFUSION IMAGING   EKG 12-Lead   ECHOCARDIOGRAM COMPLETE   No orders of the defined types were placed in this encounter.   Patient Instructions  Medication Instructions:  Your physician recommends that you continue on your current medications as directed. Please refer to the Current Medication list given to you today.  *If you need a refill on your cardiac medications before your next appointment, please call your pharmacy*   Lab Work: NONE If you have labs (blood work) drawn today and your tests are completely normal, you will receive your results only by: [place] (if you have MyChart) OR A paper copy in the mail If you have any lab test that is abnormal or we need to change your treatment, we will call you to review the results.   Testing/Procedures: ECHO Your physician has requested that you have an echocardiogram. Echocardiography is a painless test that uses sound waves to create images of your heart. It provides your doctor with information about the size and shape of your heart and how well your heart's chambers and valves are working. This procedure takes approximately one hour. There are no restrictions for this procedure.  Exercise Myoview Stress Test Your physician has requested that you  have en exercise stress myoview. For further information please visit [email or web address]. Please follow instruction sheet, as given.  Follow-Up: At [facility], you and your health needs are our priority.  As part of our continuing mission to provide you with exceptional heart care, we have created designated Provider Care Teams.  These Care Teams include your primary Cardiologist (physician) and Advanced Practice Providers (APPs -  Physician Assistants and Nurse Practitioners) who all work together to provide you with the care you need, when you need it.  Your next appointment:   3 month(s)  The format for your next appointment:   In Person  Provider:   Ronn Melena, or Keymani Glynn     Other Instructions See separate sheet for stress test instructions  Important Information About Sugar         Signed, Mertie Moores, MD  03/08/2022 1:31 PM    [place]

## 2022-03-25 ENCOUNTER — Encounter (HOSPITAL_COMMUNITY): Payer: Self-pay | Admitting: *Deleted

## 2022-04-01 ENCOUNTER — Ambulatory Visit (HOSPITAL_BASED_OUTPATIENT_CLINIC_OR_DEPARTMENT_OTHER): Payer: Medicare PPO

## 2022-04-01 ENCOUNTER — Ambulatory Visit (HOSPITAL_COMMUNITY): Payer: Medicare PPO | Attending: Cardiology

## 2022-04-01 DIAGNOSIS — R0609 Other forms of dyspnea: Secondary | ICD-10-CM | POA: Insufficient documentation

## 2022-04-01 DIAGNOSIS — I447 Left bundle-branch block, unspecified: Secondary | ICD-10-CM | POA: Insufficient documentation

## 2022-04-01 LAB — ECHOCARDIOGRAM COMPLETE
AV Mean grad: 3 mmHg
AV Peak grad: 4.5 mmHg
Ao pk vel: 1.06 m/s
Area-P 1/2: 3.83 cm2
Height: 64 in
S' Lateral: 3.1 cm
Weight: 2272 oz

## 2022-04-01 LAB — MYOCARDIAL PERFUSION IMAGING
LV dias vol: 90 mL (ref 46–106)
LV sys vol: 38 mL
Nuc Stress EF: 58 %
Peak HR: 86 {beats}/min
Rest HR: 66 {beats}/min
Rest Nuclear Isotope Dose: 10.9 mCi
SDS: 1
SRS: 1
SSS: 2
ST Depression (mm): 0 mm
Stress Nuclear Isotope Dose: 32.4 mCi
Stress perfusion cavity size (mL): 90 mL
TID: 0.99

## 2022-04-01 MED ORDER — TECHNETIUM TC 99M TETROFOSMIN IV KIT
32.4000 | PACK | Freq: Once | INTRAVENOUS | Status: AC | PRN
  Administered 2022-04-01: 32.4 via INTRAVENOUS

## 2022-04-01 MED ORDER — REGADENOSON 0.4 MG/5ML IV SOLN
0.4000 mg | Freq: Once | INTRAVENOUS | Status: AC
  Administered 2022-04-01: 0.4 mg via INTRAVENOUS

## 2022-04-01 MED ORDER — TECHNETIUM TC 99M TETROFOSMIN IV KIT
10.2000 | PACK | Freq: Once | INTRAVENOUS | Status: AC | PRN
  Administered 2022-04-01: 10.2 via INTRAVENOUS

## 2022-04-03 DIAGNOSIS — I447 Left bundle-branch block, unspecified: Secondary | ICD-10-CM | POA: Diagnosis not present

## 2022-04-03 DIAGNOSIS — Z23 Encounter for immunization: Secondary | ICD-10-CM | POA: Diagnosis not present

## 2022-04-03 DIAGNOSIS — E782 Mixed hyperlipidemia: Secondary | ICD-10-CM | POA: Diagnosis not present

## 2022-04-03 DIAGNOSIS — I10 Essential (primary) hypertension: Secondary | ICD-10-CM | POA: Diagnosis not present

## 2022-04-03 DIAGNOSIS — J449 Chronic obstructive pulmonary disease, unspecified: Secondary | ICD-10-CM | POA: Diagnosis not present

## 2022-04-04 ENCOUNTER — Other Ambulatory Visit: Payer: Self-pay | Admitting: Family Medicine

## 2022-04-04 DIAGNOSIS — Z87891 Personal history of nicotine dependence: Secondary | ICD-10-CM

## 2022-05-01 ENCOUNTER — Ambulatory Visit
Admission: RE | Admit: 2022-05-01 | Discharge: 2022-05-01 | Disposition: A | Discharge status: Home / Self Care | Payer: Medicare PPO | Source: Ambulatory Visit | Attending: Family Medicine | Admitting: Family Medicine

## 2022-05-01 DIAGNOSIS — Z87891 Personal history of nicotine dependence: Secondary | ICD-10-CM

## 2022-05-02 DIAGNOSIS — S71119A Laceration without foreign body, unspecified thigh, initial encounter: Secondary | ICD-10-CM | POA: Diagnosis not present

## 2022-05-02 DIAGNOSIS — U099 Post covid-19 condition, unspecified: Secondary | ICD-10-CM | POA: Diagnosis not present

## 2022-05-02 DIAGNOSIS — Z1339 Encounter for screening examination for other mental health and behavioral disorders: Secondary | ICD-10-CM | POA: Diagnosis not present

## 2022-05-02 DIAGNOSIS — Z1331 Encounter for screening for depression: Secondary | ICD-10-CM | POA: Diagnosis not present

## 2022-05-02 DIAGNOSIS — Z0000 Encounter for general adult medical examination without abnormal findings: Secondary | ICD-10-CM | POA: Diagnosis not present

## 2022-05-03 DIAGNOSIS — Z87891 Personal history of nicotine dependence: Secondary | ICD-10-CM | POA: Diagnosis not present

## 2022-05-03 DIAGNOSIS — J439 Emphysema, unspecified: Secondary | ICD-10-CM | POA: Diagnosis not present

## 2022-05-03 DIAGNOSIS — I700 Atherosclerosis of aorta: Secondary | ICD-10-CM | POA: Diagnosis not present

## 2022-05-03 DIAGNOSIS — R918 Other nonspecific abnormal finding of lung field: Secondary | ICD-10-CM | POA: Diagnosis not present

## 2022-05-08 DIAGNOSIS — Z23 Encounter for immunization: Secondary | ICD-10-CM | POA: Diagnosis not present

## 2022-05-13 DIAGNOSIS — I700 Atherosclerosis of aorta: Secondary | ICD-10-CM | POA: Insufficient documentation

## 2022-05-15 DIAGNOSIS — M85852 Other specified disorders of bone density and structure, left thigh: Secondary | ICD-10-CM | POA: Diagnosis not present

## 2022-05-15 DIAGNOSIS — Z780 Asymptomatic menopausal state: Secondary | ICD-10-CM | POA: Diagnosis not present

## 2022-05-15 DIAGNOSIS — M85851 Other specified disorders of bone density and structure, right thigh: Secondary | ICD-10-CM | POA: Diagnosis not present

## 2022-05-24 ENCOUNTER — Encounter: Payer: Self-pay | Admitting: Nurse Practitioner

## 2022-05-24 ENCOUNTER — Ambulatory Visit: Payer: Medicare PPO | Attending: Nurse Practitioner | Admitting: Nurse Practitioner

## 2022-05-24 VITALS — BP 138/68 | HR 73 | Ht 63.0 in | Wt 145.2 lb

## 2022-05-24 DIAGNOSIS — I447 Left bundle-branch block, unspecified: Secondary | ICD-10-CM

## 2022-05-24 DIAGNOSIS — R0609 Other forms of dyspnea: Secondary | ICD-10-CM | POA: Diagnosis not present

## 2022-05-24 DIAGNOSIS — E785 Hyperlipidemia, unspecified: Secondary | ICD-10-CM | POA: Diagnosis not present

## 2022-05-24 DIAGNOSIS — I700 Atherosclerosis of aorta: Secondary | ICD-10-CM | POA: Diagnosis not present

## 2022-05-24 DIAGNOSIS — I10 Essential (primary) hypertension: Secondary | ICD-10-CM | POA: Diagnosis not present

## 2022-05-24 NOTE — Progress Notes (Signed)
Cardiology Office Note:    Date:  05/24/2022   ID:  Molly Mccormick, DOB March 18, 1951, MRN [id number]  PCP:  Fanny Bien, MD   [facility] Providers Cardiologist:  Mertie Moores, MD     Referring MD: Fanny Bien, MD   Chief Complaint: Dyspnea on exertion  History of Present Illness:    Molly Mccormick is a very pleasant 71 y.o. female with a hx of hypertension, hyperlipidemia, and long COVID.   She was referred to cardiology by PCP for evaluation of shortness of breath after exercise and seen by Dr. Acie Fredrickson on 03/08/2022.  At that visit she reports she was very active until COVID infection #2022.  Was unable to do anything except rest for months.  She would have some good days and then be very tired the next.  States it takes her longer than 1 day to recover from a busy day.  No medication for long COVID.  Continuing to have some brain fog.  New LBBB was identified.  Echocardiogram revealed normal LV function with EF 60 to 65%, indeterminate diastolic parameters, trivial MR, aortic valve sclerosis with no evidence of aortic valve stenosis.  The scan Myoview showed no evidence of ischemia with normal LV function, low risk study. She was advised to return in 3 months for follow-up.  Today, she is here alone for follow-up. Recently diagnosed with mild emphysema in addition to symptoms of long COVID.  Continues to have some DOE. Is exercising at [facility] and feels that she is pushing herself at a moderate level. There was a new class format that really made her feel sick. She is going to try it again soon. She denies chest pain, lower extremity edema, fatigue, palpitations, melena, hematuria, hemoptysis, diaphoresis, weakness, presyncope, syncope, orthopnea, and PND. Home BP 116-125/73-76. We discussed findings of recent cardiac testing and finding of aortic atherosclerosis.   Past Medical History:  Diagnosis Date   Back pain    Bowel habit changes    COPD (chronic obstructive  pulmonary disease) (HCC)    COVID-19 long hauler    Depression    Fatigue    High cholesterol    HTN (hypertension)    Joint pain    Leukopenia    RAD (reactive airway disease)    AFTER COVID    Past Surgical History:  Procedure Laterality Date   BREAST BIOPSY Left    PROLAPSED UTERINE FIBROID LIGATION      Current Medications: Current Meds  Medication Sig   albuterol (VENTOLIN HFA) 108 (90 Base) MCG/ACT inhaler Inhale 1-2 puffs into the lungs every 6 (six) hours as needed for wheezing or shortness of breath (Rescue inhaler).   amLODipine (NORVASC) 10 MG tablet Take 10 mg by mouth daily.   bismuth subsalicylate (PEPTO BISMOL) 262 MG chewable tablet Chew 524 mg by mouth as needed.   Cholecalciferol 125 MCG (5000 UT) TABS Take by mouth.   cycloSPORINE (RESTASIS) 0.05 % ophthalmic emulsion INSTILL 1 DROP(S) IN EACH EYE BY OPHTHALMIC ROUTE TWICE A DAY   escitalopram (LEXAPRO) 10 MG tablet Take 10 mg by mouth daily.   estradiol (ESTRACE) 0.1 MG/GM vaginal cream Place 1 Applicatorful vaginally at bedtime.   fluticasone-salmeterol (ADVAIR) 500-50 MCG/ACT AEPB Inhale 1 puff into the lungs in the morning and at bedtime.   levothyroxine (SYNTHROID) 100 MCG tablet Take 100 mcg by mouth daily before breakfast.   loratadine (CLARITIN) 10 MG tablet Take 10 mg by mouth daily.   Prenatal 6.75-0.2 MG  TABS Take by mouth.   rosuvastatin (CRESTOR) 10 MG tablet Take 10 mg by mouth daily.   WIXELA INHUB 250-50 MCG/ACT AEPB 1 puff 2 (two) times daily.   [DISCONTINUED] bismuth subsalicylate (PEPTO BISMOL) 262 MG chewable tablet Chew 524 mg by mouth as needed.     Allergies:   Patient has no known allergies.   Social History   Socioeconomic History   Marital status: Divorced    Spouse name: Not on file   Number of children: Not on file   Years of education: Not on file   Highest education level: Not on file  Occupational History   Not on file  Tobacco Use   Smoking status: Former    Types:  Cigarettes   Smokeless tobacco: Never  Substance and Sexual Activity   Alcohol use: Not on file   Drug use: Not on file   Sexual activity: Not on file  Other Topics Concern   Not on file  Social History Narrative   Not on file   Social Determinants of Health   Financial Resource Strain: Not on file  Food Insecurity: Not on file  Transportation Needs: Not on file  Physical Activity: Not on file  Stress: Not on file  Social Connections: Not on file     Family History: The patient's family history includes Breast cancer in her mother; Breast cancer (age of onset: 84) in her sister; Cancer in her mother and paternal grandmother; Hyperlipidemia in her maternal grandfather, maternal grandmother, and mother; Hypertension in her brother, maternal grandmother, and mother; Osteoporosis in her mother.  ROS:   Please see the history of present illness.    + DOE All other systems reviewed and are negative.  Labs/Other Studies Reviewed:    The following studies were reviewed today:  Echo 04/01/22  1. Left ventricular ejection fraction, by estimation, is 60 to 65%. The  left ventricle has normal function. The left ventricle has no regional  wall motion abnormalities. Left ventricular diastolic parameters are  indeterminate. The average left  ventricular global longitudinal strain is -19.4 %. The global longitudinal  strain is normal.   2. Right ventricular systolic function is normal. The right ventricular  size is normal. There is normal pulmonary artery systolic pressure.   3. The mitral valve is normal in structure. Trivial mitral valve  regurgitation. No evidence of mitral stenosis.   4. The aortic valve is tricuspid. There is mild calcification of the  aortic valve. Aortic valve regurgitation is not visualized. Aortic valve  sclerosis is present, with no evidence of aortic valve stenosis.   5. The inferior vena cava is normal in size with greater than 50%  respiratory  variability, suggesting right atrial pressure of 3 mmHg.   Comparison(s): No prior Echocardiogram.   Lexiscan Myoview 04/01/22    LV perfusion is normal. There is no evidence of ischemia. There is no evidence of infarction. Rediced apical counts with normal wall motion consistent with apical thinning artifact.   Left ventricular function is normal. Nuclear stress EF: 58 %. The left ventricular ejection fraction is normal (55-65%). End diastolic cavity size is normal.   The study is normal. The study is low risk.   Recent Labs:  Recent Lipid Panel   Risk Assessment/Calculations:       Physical Exam:    VS:  BP 138/68   Pulse 73   Ht '5\' 3"'$  (1.6 m)   Wt 145 lb 3.2 oz (65.9 kg)   SpO2 96%  BMI 25.72 kg/m     Wt Readings from Last 3 Encounters:  05/24/22 145 lb 3.2 oz (65.9 kg)  04/01/22 142 lb (64.4 kg)  03/08/22 142 lb 9.6 oz (64.7 kg)     GEN:  Well nourished, well developed in no acute distress HEENT: Normal NECK: No JVD; No carotid bruits CARDIAC: RRR, no murmurs, rubs, gallops RESPIRATORY:  Clear to auscultation without rales, wheezing or rhonchi  ABDOMEN: Soft, non-tender, non-distended MUSCULOSKELETAL:  No edema; No deformity. 2+ pedal pulses, equal bilaterally SKIN: Warm and dry NEUROLOGIC:  Alert and oriented x 3 PSYCHIATRIC:  Normal affect   EKG:  EKG is not ordered today.    Diagnoses:    1. Aortic atherosclerosis ([place])   2. LBBB (left bundle branch block)   3. DOE (dyspnea on exertion)   4. Hyperlipidemia, unspecified hyperlipidemia type   5. Essential hypertension    Assessment and Plan:     DOE: Continues to have some mild DOE. No chest pain. Recently diagnosed with mild emphysema. Has resumed exercise and is tolerating this well with the exception of one occasion. Was a different exercise format, plans to try it again. Continue moderate intensity exercise. Advised her to contact us prior to next appointment if she has any  concerns.  Hypertension: BP a little elevated today. She monitors on a consistent basis. Home BP readings well-controlled.   LBBB: Chronic LBBB. LV function normal on echo 04/01/22.   Aortic atherosclerosis/Hyperlipidemia: LDL 101 on 11/30/20. She reports more recent lipids that she was told were good. Discussed importance of lower LDL with aortic atherosclerosis. Is interested in getting a coronary calcium score, will likely wait until the first of the year because she has a very busy fall.  We will place order for her to get test at her convenience. Continue rosuvastatin.     Disposition: 1 year with Dr. Acie Fredrickson  Medication Adjustments/Labs and Tests Ordered: Current medicines are reviewed at length with the patient today.  Concerns regarding medicines are outlined above.  Orders Placed This Encounter  Procedures   CT CARDIAC SCORING (SELF PAY ONLY)   No orders of the defined types were placed in this encounter.   Patient Instructions  Medication Instructions:   Your physician recommends that you continue on your current medications as directed. Please refer to the Current Medication list given to you today.   *If you need a refill on your cardiac medications before your next appointment, please call your pharmacy*   Lab Work:  None ordered.  If you have labs (blood work) drawn today and your tests are completely normal, you will receive your results only by: [place] (if you have MyChart) OR A paper copy in the mail If you have any lab test that is abnormal or we need to change your treatment, we will call you to review the results.   Testing/Procedures:  Order placed in system today for (SELF PAY) Calcium Scoring, 99.00 dollars.    Follow-Up: At [facility], you and your health needs are our priority.  As part of our continuing mission to provide you with exceptional heart care, we have created designated Provider Care Teams.  These Care Teams include  your primary Cardiologist (physician) and Advanced Practice Providers (APPs -  Physician Assistants and Nurse Practitioners) who all work together to provide you with the care you need, when you need it.  We recommend signing up for the patient portal called "MyChart".  Sign up information is provided on  this After Visit Summary.  MyChart is used to connect with patients for Virtual Visits (Telemedicine).  Patients are able to view lab/test results, encounter notes, upcoming appointments, etc.  Non-urgent messages can be sent to your provider as well.   To learn more about what you can do with MyChart, go to [email or web address].    Your next appointment:   1 year(s)  The format for your next appointment:   In Person  Provider:   Dr. Alfonzo Beers   Other Instructions  Your physician wants you to follow-up in: 1 year with Dr. Cathie Olden.  You will receive a reminder letter in the mail two months in advance. If you don't receive a letter, please call our office to schedule the follow-up appointment.   Important Information About Sugar         Signed, Emmaline Life, NP  05/24/2022 1:22 PM    [place]

## 2022-05-24 NOTE — Patient Instructions (Signed)
Medication Instructions:   Your physician recommends that you continue on your current medications as directed. Please refer to the Current Medication list given to you today.   *If you need a refill on your cardiac medications before your next appointment, please call your pharmacy*   Lab Work:  None ordered.  If you have labs (blood work) drawn today and your tests are completely normal, you will receive your results only by: [place] (if you have MyChart) OR A paper copy in the mail If you have any lab test that is abnormal or we need to change your treatment, we will call you to review the results.   Testing/Procedures:  Order placed in system today for (SELF PAY) Calcium Scoring, 99.00 dollars.    Follow-Up: At [facility], you and your health needs are our priority.  As part of our continuing mission to provide you with exceptional heart care, we have created designated Provider Care Teams.  These Care Teams include your primary Cardiologist (physician) and Advanced Practice Providers (APPs -  Physician Assistants and Nurse Practitioners) who all work together to provide you with the care you need, when you need it.  We recommend signing up for the patient portal called "MyChart".  Sign up information is provided on this After Visit Summary.  MyChart is used to connect with patients for Virtual Visits (Telemedicine).  Patients are able to view lab/test results, encounter notes, upcoming appointments, etc.  Non-urgent messages can be sent to your provider as well.   To learn more about what you can do with MyChart, go to [email or web address].    Your next appointment:   1 year(s)  The format for your next appointment:   In Person  Provider:   Dr. Alfonzo Beers   Other Instructions  Your physician wants you to follow-up in: 1 year with Dr. Cathie Olden.  You will receive a reminder letter in the mail two months in advance. If you don't receive a letter,  please call our office to schedule the follow-up appointment.   Important Information About Sugar

## 2022-06-10 ENCOUNTER — Other Ambulatory Visit (HOSPITAL_COMMUNITY): Payer: Medicare PPO

## 2022-06-18 DIAGNOSIS — E039 Hypothyroidism, unspecified: Secondary | ICD-10-CM | POA: Diagnosis not present

## 2022-06-18 DIAGNOSIS — K219 Gastro-esophageal reflux disease without esophagitis: Secondary | ICD-10-CM | POA: Diagnosis not present

## 2022-06-18 DIAGNOSIS — J309 Allergic rhinitis, unspecified: Secondary | ICD-10-CM | POA: Diagnosis not present

## 2022-06-18 DIAGNOSIS — M8580 Other specified disorders of bone density and structure, unspecified site: Secondary | ICD-10-CM | POA: Diagnosis not present

## 2022-06-18 DIAGNOSIS — Z23 Encounter for immunization: Secondary | ICD-10-CM | POA: Diagnosis not present

## 2022-06-18 DIAGNOSIS — J040 Acute laryngitis: Secondary | ICD-10-CM | POA: Diagnosis not present

## 2022-06-18 DIAGNOSIS — E559 Vitamin D deficiency, unspecified: Secondary | ICD-10-CM | POA: Diagnosis not present

## 2022-07-09 DIAGNOSIS — U099 Post covid-19 condition, unspecified: Secondary | ICD-10-CM | POA: Diagnosis not present

## 2022-07-09 DIAGNOSIS — E559 Vitamin D deficiency, unspecified: Secondary | ICD-10-CM | POA: Diagnosis not present

## 2022-07-09 DIAGNOSIS — J309 Allergic rhinitis, unspecified: Secondary | ICD-10-CM | POA: Diagnosis not present

## 2022-07-09 DIAGNOSIS — K219 Gastro-esophageal reflux disease without esophagitis: Secondary | ICD-10-CM | POA: Diagnosis not present

## 2022-08-21 DIAGNOSIS — E039 Hypothyroidism, unspecified: Secondary | ICD-10-CM | POA: Diagnosis not present

## 2022-08-21 DIAGNOSIS — E559 Vitamin D deficiency, unspecified: Secondary | ICD-10-CM | POA: Diagnosis not present

## 2022-08-27 DIAGNOSIS — E039 Hypothyroidism, unspecified: Secondary | ICD-10-CM | POA: Diagnosis not present

## 2022-08-27 DIAGNOSIS — F411 Generalized anxiety disorder: Secondary | ICD-10-CM | POA: Diagnosis not present

## 2022-08-27 DIAGNOSIS — F331 Major depressive disorder, recurrent, moderate: Secondary | ICD-10-CM | POA: Diagnosis not present

## 2022-08-27 DIAGNOSIS — G4700 Insomnia, unspecified: Secondary | ICD-10-CM | POA: Diagnosis not present

## 2022-08-27 DIAGNOSIS — J449 Chronic obstructive pulmonary disease, unspecified: Secondary | ICD-10-CM | POA: Diagnosis not present

## 2022-08-27 DIAGNOSIS — E559 Vitamin D deficiency, unspecified: Secondary | ICD-10-CM | POA: Diagnosis not present

## 2022-09-17 DIAGNOSIS — Z961 Presence of intraocular lens: Secondary | ICD-10-CM | POA: Diagnosis not present

## 2022-09-17 DIAGNOSIS — H43811 Vitreous degeneration, right eye: Secondary | ICD-10-CM | POA: Diagnosis not present

## 2022-09-17 DIAGNOSIS — H16223 Keratoconjunctivitis sicca, not specified as Sjogren's, bilateral: Secondary | ICD-10-CM | POA: Diagnosis not present

## 2022-11-05 DIAGNOSIS — H11433 Conjunctival hyperemia, bilateral: Secondary | ICD-10-CM | POA: Diagnosis not present

## 2022-12-31 DIAGNOSIS — E559 Vitamin D deficiency, unspecified: Secondary | ICD-10-CM | POA: Diagnosis not present

## 2023-01-07 DIAGNOSIS — E559 Vitamin D deficiency, unspecified: Secondary | ICD-10-CM | POA: Diagnosis not present

## 2023-01-07 DIAGNOSIS — J449 Chronic obstructive pulmonary disease, unspecified: Secondary | ICD-10-CM | POA: Diagnosis not present

## 2023-01-07 DIAGNOSIS — K219 Gastro-esophageal reflux disease without esophagitis: Secondary | ICD-10-CM | POA: Diagnosis not present

## 2023-01-07 DIAGNOSIS — Z7185 Encounter for immunization safety counseling: Secondary | ICD-10-CM | POA: Diagnosis not present

## 2023-01-07 DIAGNOSIS — I10 Essential (primary) hypertension: Secondary | ICD-10-CM | POA: Diagnosis not present

## 2023-01-07 DIAGNOSIS — Z23 Encounter for immunization: Secondary | ICD-10-CM | POA: Diagnosis not present

## 2023-04-09 DIAGNOSIS — I10 Essential (primary) hypertension: Secondary | ICD-10-CM | POA: Diagnosis not present

## 2023-04-09 DIAGNOSIS — E039 Hypothyroidism, unspecified: Secondary | ICD-10-CM | POA: Diagnosis not present

## 2023-04-14 DIAGNOSIS — K219 Gastro-esophageal reflux disease without esophagitis: Secondary | ICD-10-CM | POA: Diagnosis not present

## 2023-04-14 DIAGNOSIS — M8580 Other specified disorders of bone density and structure, unspecified site: Secondary | ICD-10-CM | POA: Diagnosis not present

## 2023-04-14 DIAGNOSIS — E039 Hypothyroidism, unspecified: Secondary | ICD-10-CM | POA: Diagnosis not present

## 2023-04-30 DIAGNOSIS — H16223 Keratoconjunctivitis sicca, not specified as Sjogren's, bilateral: Secondary | ICD-10-CM | POA: Diagnosis not present

## 2023-04-30 DIAGNOSIS — H02885 Meibomian gland dysfunction left lower eyelid: Secondary | ICD-10-CM | POA: Diagnosis not present

## 2023-04-30 DIAGNOSIS — H02882 Meibomian gland dysfunction right lower eyelid: Secondary | ICD-10-CM | POA: Diagnosis not present

## 2023-06-16 DIAGNOSIS — R5383 Other fatigue: Secondary | ICD-10-CM | POA: Diagnosis not present

## 2023-06-16 DIAGNOSIS — E039 Hypothyroidism, unspecified: Secondary | ICD-10-CM | POA: Diagnosis not present

## 2023-06-16 DIAGNOSIS — I10 Essential (primary) hypertension: Secondary | ICD-10-CM | POA: Diagnosis not present

## 2023-06-16 DIAGNOSIS — E785 Hyperlipidemia, unspecified: Secondary | ICD-10-CM | POA: Diagnosis not present

## 2023-06-17 DIAGNOSIS — K5730 Diverticulosis of large intestine without perforation or abscess without bleeding: Secondary | ICD-10-CM | POA: Diagnosis not present

## 2023-06-17 DIAGNOSIS — Z1211 Encounter for screening for malignant neoplasm of colon: Secondary | ICD-10-CM | POA: Diagnosis not present

## 2023-06-17 DIAGNOSIS — K449 Diaphragmatic hernia without obstruction or gangrene: Secondary | ICD-10-CM | POA: Diagnosis not present

## 2023-06-17 DIAGNOSIS — E039 Hypothyroidism, unspecified: Secondary | ICD-10-CM | POA: Diagnosis not present

## 2023-06-17 DIAGNOSIS — Z86010 Personal history of colonic polyps: Secondary | ICD-10-CM | POA: Diagnosis not present

## 2023-06-17 DIAGNOSIS — K219 Gastro-esophageal reflux disease without esophagitis: Secondary | ICD-10-CM | POA: Diagnosis not present

## 2023-06-18 DIAGNOSIS — I10 Essential (primary) hypertension: Secondary | ICD-10-CM | POA: Diagnosis not present

## 2023-06-18 DIAGNOSIS — Z23 Encounter for immunization: Secondary | ICD-10-CM | POA: Diagnosis not present

## 2023-06-18 DIAGNOSIS — E782 Mixed hyperlipidemia: Secondary | ICD-10-CM | POA: Diagnosis not present

## 2023-06-18 DIAGNOSIS — E039 Hypothyroidism, unspecified: Secondary | ICD-10-CM | POA: Diagnosis not present

## 2023-06-18 DIAGNOSIS — U099 Post covid-19 condition, unspecified: Secondary | ICD-10-CM | POA: Diagnosis not present

## 2023-06-18 DIAGNOSIS — Z789 Other specified health status: Secondary | ICD-10-CM | POA: Diagnosis not present

## 2023-07-03 DIAGNOSIS — G4700 Insomnia, unspecified: Secondary | ICD-10-CM | POA: Diagnosis not present

## 2023-07-03 DIAGNOSIS — Z1331 Encounter for screening for depression: Secondary | ICD-10-CM | POA: Diagnosis not present

## 2023-07-03 DIAGNOSIS — F411 Generalized anxiety disorder: Secondary | ICD-10-CM | POA: Diagnosis not present

## 2023-07-03 DIAGNOSIS — Z1339 Encounter for screening examination for other mental health and behavioral disorders: Secondary | ICD-10-CM | POA: Diagnosis not present

## 2023-07-03 DIAGNOSIS — F331 Major depressive disorder, recurrent, moderate: Secondary | ICD-10-CM | POA: Diagnosis not present

## 2023-07-03 DIAGNOSIS — Z0000 Encounter for general adult medical examination without abnormal findings: Secondary | ICD-10-CM | POA: Diagnosis not present

## 2023-07-09 DIAGNOSIS — K635 Polyp of colon: Secondary | ICD-10-CM | POA: Diagnosis not present

## 2023-07-09 DIAGNOSIS — Z860102 Personal history of hyperplastic colon polyps: Secondary | ICD-10-CM | POA: Diagnosis not present

## 2023-07-09 DIAGNOSIS — D124 Benign neoplasm of descending colon: Secondary | ICD-10-CM | POA: Diagnosis not present

## 2023-07-09 DIAGNOSIS — D123 Benign neoplasm of transverse colon: Secondary | ICD-10-CM | POA: Diagnosis not present

## 2023-07-09 DIAGNOSIS — K5730 Diverticulosis of large intestine without perforation or abscess without bleeding: Secondary | ICD-10-CM | POA: Diagnosis not present

## 2023-07-09 DIAGNOSIS — Z860101 Personal history of adenomatous and serrated colon polyps: Secondary | ICD-10-CM | POA: Diagnosis not present

## 2023-07-09 DIAGNOSIS — D122 Benign neoplasm of ascending colon: Secondary | ICD-10-CM | POA: Diagnosis not present

## 2023-07-09 DIAGNOSIS — D125 Benign neoplasm of sigmoid colon: Secondary | ICD-10-CM | POA: Diagnosis not present

## 2023-07-09 DIAGNOSIS — Z1211 Encounter for screening for malignant neoplasm of colon: Secondary | ICD-10-CM | POA: Diagnosis not present

## 2023-07-10 DIAGNOSIS — Z23 Encounter for immunization: Secondary | ICD-10-CM | POA: Diagnosis not present

## 2023-10-10 DIAGNOSIS — J0140 Acute pansinusitis, unspecified: Secondary | ICD-10-CM | POA: Diagnosis not present

## 2023-10-10 DIAGNOSIS — H6691 Otitis media, unspecified, right ear: Secondary | ICD-10-CM | POA: Diagnosis not present

## 2023-10-16 DIAGNOSIS — E559 Vitamin D deficiency, unspecified: Secondary | ICD-10-CM | POA: Diagnosis not present

## 2023-10-16 DIAGNOSIS — R5383 Other fatigue: Secondary | ICD-10-CM | POA: Diagnosis not present

## 2023-10-20 DIAGNOSIS — I10 Essential (primary) hypertension: Secondary | ICD-10-CM | POA: Diagnosis not present

## 2023-10-20 DIAGNOSIS — J069 Acute upper respiratory infection, unspecified: Secondary | ICD-10-CM | POA: Diagnosis not present

## 2023-10-20 DIAGNOSIS — B9689 Other specified bacterial agents as the cause of diseases classified elsewhere: Secondary | ICD-10-CM | POA: Diagnosis not present

## 2023-10-20 DIAGNOSIS — J329 Chronic sinusitis, unspecified: Secondary | ICD-10-CM | POA: Diagnosis not present

## 2023-10-20 DIAGNOSIS — Z1159 Encounter for screening for other viral diseases: Secondary | ICD-10-CM | POA: Diagnosis not present

## 2023-10-20 DIAGNOSIS — E039 Hypothyroidism, unspecified: Secondary | ICD-10-CM | POA: Diagnosis not present

## 2023-10-20 DIAGNOSIS — E559 Vitamin D deficiency, unspecified: Secondary | ICD-10-CM | POA: Diagnosis not present

## 2023-10-22 DIAGNOSIS — G4700 Insomnia, unspecified: Secondary | ICD-10-CM | POA: Diagnosis not present

## 2023-10-22 DIAGNOSIS — F411 Generalized anxiety disorder: Secondary | ICD-10-CM | POA: Diagnosis not present

## 2023-10-22 DIAGNOSIS — H6693 Otitis media, unspecified, bilateral: Secondary | ICD-10-CM | POA: Diagnosis not present

## 2023-10-22 DIAGNOSIS — F331 Major depressive disorder, recurrent, moderate: Secondary | ICD-10-CM | POA: Diagnosis not present

## 2023-10-22 DIAGNOSIS — I10 Essential (primary) hypertension: Secondary | ICD-10-CM | POA: Diagnosis not present

## 2023-10-27 DIAGNOSIS — R0602 Shortness of breath: Secondary | ICD-10-CM | POA: Diagnosis not present

## 2023-10-27 DIAGNOSIS — J069 Acute upper respiratory infection, unspecified: Secondary | ICD-10-CM | POA: Diagnosis not present

## 2023-10-27 DIAGNOSIS — B9689 Other specified bacterial agents as the cause of diseases classified elsewhere: Secondary | ICD-10-CM | POA: Diagnosis not present

## 2023-10-27 DIAGNOSIS — J329 Chronic sinusitis, unspecified: Secondary | ICD-10-CM | POA: Diagnosis not present

## 2023-10-27 DIAGNOSIS — I10 Essential (primary) hypertension: Secondary | ICD-10-CM | POA: Diagnosis not present

## 2023-10-27 DIAGNOSIS — Z1159 Encounter for screening for other viral diseases: Secondary | ICD-10-CM | POA: Diagnosis not present

## 2023-10-27 DIAGNOSIS — J439 Emphysema, unspecified: Secondary | ICD-10-CM | POA: Diagnosis not present

## 2023-10-29 DIAGNOSIS — R0602 Shortness of breath: Secondary | ICD-10-CM | POA: Diagnosis not present

## 2023-10-29 DIAGNOSIS — B9689 Other specified bacterial agents as the cause of diseases classified elsewhere: Secondary | ICD-10-CM | POA: Diagnosis not present

## 2023-10-29 DIAGNOSIS — J439 Emphysema, unspecified: Secondary | ICD-10-CM | POA: Diagnosis not present

## 2023-10-29 DIAGNOSIS — I10 Essential (primary) hypertension: Secondary | ICD-10-CM | POA: Diagnosis not present

## 2023-10-29 DIAGNOSIS — J069 Acute upper respiratory infection, unspecified: Secondary | ICD-10-CM | POA: Diagnosis not present

## 2023-10-29 DIAGNOSIS — J329 Chronic sinusitis, unspecified: Secondary | ICD-10-CM | POA: Diagnosis not present

## 2023-10-30 DIAGNOSIS — J329 Chronic sinusitis, unspecified: Secondary | ICD-10-CM | POA: Diagnosis not present

## 2023-10-30 DIAGNOSIS — J45901 Unspecified asthma with (acute) exacerbation: Secondary | ICD-10-CM | POA: Diagnosis not present

## 2023-10-30 DIAGNOSIS — J4540 Moderate persistent asthma, uncomplicated: Secondary | ICD-10-CM | POA: Diagnosis not present

## 2023-10-30 DIAGNOSIS — R0602 Shortness of breath: Secondary | ICD-10-CM | POA: Diagnosis not present

## 2023-10-30 DIAGNOSIS — J45990 Exercise induced bronchospasm: Secondary | ICD-10-CM | POA: Diagnosis not present

## 2023-10-31 DIAGNOSIS — R0602 Shortness of breath: Secondary | ICD-10-CM | POA: Diagnosis not present

## 2023-11-05 DIAGNOSIS — J45901 Unspecified asthma with (acute) exacerbation: Secondary | ICD-10-CM | POA: Diagnosis not present

## 2023-11-05 DIAGNOSIS — J439 Emphysema, unspecified: Secondary | ICD-10-CM | POA: Diagnosis not present

## 2023-11-05 DIAGNOSIS — I10 Essential (primary) hypertension: Secondary | ICD-10-CM | POA: Diagnosis not present

## 2023-11-05 DIAGNOSIS — J4540 Moderate persistent asthma, uncomplicated: Secondary | ICD-10-CM | POA: Diagnosis not present

## 2023-11-18 DIAGNOSIS — H16223 Keratoconjunctivitis sicca, not specified as Sjogren's, bilateral: Secondary | ICD-10-CM | POA: Diagnosis not present

## 2023-11-18 DIAGNOSIS — H02882 Meibomian gland dysfunction right lower eyelid: Secondary | ICD-10-CM | POA: Diagnosis not present

## 2023-11-18 DIAGNOSIS — H43811 Vitreous degeneration, right eye: Secondary | ICD-10-CM | POA: Diagnosis not present

## 2023-11-18 DIAGNOSIS — H02885 Meibomian gland dysfunction left lower eyelid: Secondary | ICD-10-CM | POA: Diagnosis not present

## 2023-11-19 DIAGNOSIS — F331 Major depressive disorder, recurrent, moderate: Secondary | ICD-10-CM | POA: Diagnosis not present

## 2023-11-19 DIAGNOSIS — G4700 Insomnia, unspecified: Secondary | ICD-10-CM | POA: Diagnosis not present

## 2023-11-19 DIAGNOSIS — I10 Essential (primary) hypertension: Secondary | ICD-10-CM | POA: Diagnosis not present

## 2023-11-19 DIAGNOSIS — F411 Generalized anxiety disorder: Secondary | ICD-10-CM | POA: Diagnosis not present

## 2023-11-19 DIAGNOSIS — J449 Chronic obstructive pulmonary disease, unspecified: Secondary | ICD-10-CM | POA: Diagnosis not present

## 2023-11-26 DIAGNOSIS — J069 Acute upper respiratory infection, unspecified: Secondary | ICD-10-CM | POA: Diagnosis not present

## 2023-12-04 DIAGNOSIS — Z13220 Encounter for screening for lipoid disorders: Secondary | ICD-10-CM | POA: Diagnosis not present

## 2023-12-04 DIAGNOSIS — Z0000 Encounter for general adult medical examination without abnormal findings: Secondary | ICD-10-CM | POA: Diagnosis not present

## 2023-12-04 DIAGNOSIS — E782 Mixed hyperlipidemia: Secondary | ICD-10-CM | POA: Diagnosis not present

## 2023-12-16 DIAGNOSIS — Z122 Encounter for screening for malignant neoplasm of respiratory organs: Secondary | ICD-10-CM | POA: Diagnosis not present

## 2023-12-16 DIAGNOSIS — Z0000 Encounter for general adult medical examination without abnormal findings: Secondary | ICD-10-CM | POA: Diagnosis not present

## 2023-12-16 DIAGNOSIS — I10 Essential (primary) hypertension: Secondary | ICD-10-CM | POA: Diagnosis not present

## 2023-12-16 DIAGNOSIS — M8580 Other specified disorders of bone density and structure, unspecified site: Secondary | ICD-10-CM | POA: Diagnosis not present

## 2023-12-17 DIAGNOSIS — J219 Acute bronchiolitis, unspecified: Secondary | ICD-10-CM | POA: Diagnosis not present

## 2023-12-17 DIAGNOSIS — I10 Essential (primary) hypertension: Secondary | ICD-10-CM | POA: Diagnosis not present

## 2023-12-17 DIAGNOSIS — J449 Chronic obstructive pulmonary disease, unspecified: Secondary | ICD-10-CM | POA: Diagnosis not present

## 2023-12-17 DIAGNOSIS — Z7185 Encounter for immunization safety counseling: Secondary | ICD-10-CM | POA: Diagnosis not present

## 2024-01-09 DIAGNOSIS — R5383 Other fatigue: Secondary | ICD-10-CM | POA: Diagnosis not present

## 2024-01-14 DIAGNOSIS — E039 Hypothyroidism, unspecified: Secondary | ICD-10-CM | POA: Diagnosis not present

## 2024-01-14 DIAGNOSIS — J449 Chronic obstructive pulmonary disease, unspecified: Secondary | ICD-10-CM | POA: Diagnosis not present

## 2024-01-14 DIAGNOSIS — I10 Essential (primary) hypertension: Secondary | ICD-10-CM | POA: Diagnosis not present

## 2024-05-17 DIAGNOSIS — Z1231 Encounter for screening mammogram for malignant neoplasm of breast: Secondary | ICD-10-CM | POA: Diagnosis not present

## 2024-05-17 DIAGNOSIS — M8589 Other specified disorders of bone density and structure, multiple sites: Secondary | ICD-10-CM | POA: Diagnosis not present

## 2024-05-19 DIAGNOSIS — R197 Diarrhea, unspecified: Secondary | ICD-10-CM | POA: Diagnosis not present

## 2024-05-19 DIAGNOSIS — R5383 Other fatigue: Secondary | ICD-10-CM | POA: Diagnosis not present

## 2024-05-19 DIAGNOSIS — Z23 Encounter for immunization: Secondary | ICD-10-CM | POA: Diagnosis not present

## 2024-05-19 DIAGNOSIS — Z8719 Personal history of other diseases of the digestive system: Secondary | ICD-10-CM | POA: Diagnosis not present

## 2024-05-19 DIAGNOSIS — I10 Essential (primary) hypertension: Secondary | ICD-10-CM | POA: Diagnosis not present

## 2024-05-19 DIAGNOSIS — E039 Hypothyroidism, unspecified: Secondary | ICD-10-CM | POA: Diagnosis not present

## 2024-05-20 DIAGNOSIS — R5383 Other fatigue: Secondary | ICD-10-CM | POA: Diagnosis not present

## 2024-05-20 DIAGNOSIS — E039 Hypothyroidism, unspecified: Secondary | ICD-10-CM | POA: Diagnosis not present

## 2024-05-20 DIAGNOSIS — R197 Diarrhea, unspecified: Secondary | ICD-10-CM | POA: Diagnosis not present

## 2024-05-25 DIAGNOSIS — K5900 Constipation, unspecified: Secondary | ICD-10-CM | POA: Diagnosis not present

## 2024-05-25 DIAGNOSIS — M8580 Other specified disorders of bone density and structure, unspecified site: Secondary | ICD-10-CM | POA: Diagnosis not present

## 2024-05-25 DIAGNOSIS — I10 Essential (primary) hypertension: Secondary | ICD-10-CM | POA: Diagnosis not present

## 2024-05-25 DIAGNOSIS — E559 Vitamin D deficiency, unspecified: Secondary | ICD-10-CM | POA: Diagnosis not present

## 2024-06-08 DIAGNOSIS — Z7185 Encounter for immunization safety counseling: Secondary | ICD-10-CM | POA: Diagnosis not present

## 2024-06-08 DIAGNOSIS — J449 Chronic obstructive pulmonary disease, unspecified: Secondary | ICD-10-CM | POA: Diagnosis not present

## 2024-06-08 DIAGNOSIS — Z23 Encounter for immunization: Secondary | ICD-10-CM | POA: Diagnosis not present

## 2024-06-14 DIAGNOSIS — K5900 Constipation, unspecified: Secondary | ICD-10-CM | POA: Diagnosis not present

## 2024-06-14 DIAGNOSIS — J449 Chronic obstructive pulmonary disease, unspecified: Secondary | ICD-10-CM | POA: Diagnosis not present

## 2024-06-15 DIAGNOSIS — K5900 Constipation, unspecified: Secondary | ICD-10-CM | POA: Diagnosis not present

## 2024-06-24 DIAGNOSIS — R194 Change in bowel habit: Secondary | ICD-10-CM | POA: Diagnosis not present

## 2024-06-24 DIAGNOSIS — Z860100 Personal history of colon polyps, unspecified: Secondary | ICD-10-CM | POA: Diagnosis not present

## 2024-06-24 DIAGNOSIS — K219 Gastro-esophageal reflux disease without esophagitis: Secondary | ICD-10-CM | POA: Diagnosis not present

## 2024-06-24 DIAGNOSIS — Z1211 Encounter for screening for malignant neoplasm of colon: Secondary | ICD-10-CM | POA: Diagnosis not present

## 2024-07-02 DIAGNOSIS — K6389 Other specified diseases of intestine: Secondary | ICD-10-CM | POA: Diagnosis not present

## 2024-07-02 DIAGNOSIS — D122 Benign neoplasm of ascending colon: Secondary | ICD-10-CM | POA: Diagnosis not present

## 2024-07-02 DIAGNOSIS — Z860100 Personal history of colon polyps, unspecified: Secondary | ICD-10-CM | POA: Diagnosis not present

## 2024-07-02 DIAGNOSIS — Z1211 Encounter for screening for malignant neoplasm of colon: Secondary | ICD-10-CM | POA: Diagnosis not present

## 2024-07-02 DIAGNOSIS — K635 Polyp of colon: Secondary | ICD-10-CM | POA: Diagnosis not present

## 2024-07-02 DIAGNOSIS — K648 Other hemorrhoids: Secondary | ICD-10-CM | POA: Diagnosis not present

## 2024-07-02 DIAGNOSIS — K5730 Diverticulosis of large intestine without perforation or abscess without bleeding: Secondary | ICD-10-CM | POA: Diagnosis not present

## 2024-07-02 DIAGNOSIS — D123 Benign neoplasm of transverse colon: Secondary | ICD-10-CM | POA: Diagnosis not present

## 2024-07-02 DIAGNOSIS — K634 Enteroptosis: Secondary | ICD-10-CM | POA: Diagnosis not present

## 2024-07-05 DIAGNOSIS — K5900 Constipation, unspecified: Secondary | ICD-10-CM | POA: Diagnosis not present

## 2024-07-05 DIAGNOSIS — G4700 Insomnia, unspecified: Secondary | ICD-10-CM | POA: Diagnosis not present

## 2024-07-05 DIAGNOSIS — Z1339 Encounter for screening examination for other mental health and behavioral disorders: Secondary | ICD-10-CM | POA: Diagnosis not present

## 2024-07-05 DIAGNOSIS — Z1331 Encounter for screening for depression: Secondary | ICD-10-CM | POA: Diagnosis not present

## 2024-07-05 DIAGNOSIS — F331 Major depressive disorder, recurrent, moderate: Secondary | ICD-10-CM | POA: Diagnosis not present

## 2024-07-05 DIAGNOSIS — I10 Essential (primary) hypertension: Secondary | ICD-10-CM | POA: Diagnosis not present

## 2024-07-05 DIAGNOSIS — F411 Generalized anxiety disorder: Secondary | ICD-10-CM | POA: Diagnosis not present

## 2024-07-05 DIAGNOSIS — Z0000 Encounter for general adult medical examination without abnormal findings: Secondary | ICD-10-CM | POA: Diagnosis not present

## 2024-08-04 DIAGNOSIS — M62838 Other muscle spasm: Secondary | ICD-10-CM | POA: Diagnosis not present

## 2024-08-04 DIAGNOSIS — F331 Major depressive disorder, recurrent, moderate: Secondary | ICD-10-CM | POA: Diagnosis not present

## 2024-08-04 DIAGNOSIS — M542 Cervicalgia: Secondary | ICD-10-CM | POA: Diagnosis not present
